# Patient Record
Sex: Female | Born: 1985 | Race: White | Hispanic: No | Smoking: Former smoker
Health system: Southern US, Community
[De-identification: ages and names within clinical notes are randomized; demographics above are authoritative.]

## PROBLEM LIST (undated history)

## (undated) ENCOUNTER — Ambulatory Visit: Admission: RE | Discharge status: Absent without leave | Payer: BLUE CROSS/BLUE SHIELD | Source: Ambulatory Visit

## (undated) DIAGNOSIS — K589 Irritable bowel syndrome without diarrhea: Secondary | ICD-10-CM

## (undated) DIAGNOSIS — G43909 Migraine, unspecified, not intractable, without status migrainosus: Secondary | ICD-10-CM

## (undated) DIAGNOSIS — K259 Gastric ulcer, unspecified as acute or chronic, without hemorrhage or perforation: Secondary | ICD-10-CM

## (undated) DIAGNOSIS — R42 Dizziness and giddiness: Secondary | ICD-10-CM

## (undated) DIAGNOSIS — F329 Major depressive disorder, single episode, unspecified: Secondary | ICD-10-CM

## (undated) DIAGNOSIS — Z87820 Personal history of traumatic brain injury: Secondary | ICD-10-CM

## (undated) DIAGNOSIS — K5010 Crohn's disease of large intestine without complications: Secondary | ICD-10-CM

## (undated) DIAGNOSIS — F32A Depression, unspecified: Secondary | ICD-10-CM

## (undated) DIAGNOSIS — K5100 Ulcerative (chronic) pancolitis without complications: Secondary | ICD-10-CM

## (undated) DIAGNOSIS — F319 Bipolar disorder, unspecified: Secondary | ICD-10-CM

## (undated) DIAGNOSIS — F419 Anxiety disorder, unspecified: Secondary | ICD-10-CM

## (undated) HISTORY — PX: RHINOPLASTY: SUR1284

## (undated) HISTORY — PX: TUBAL LIGATION: SHX77

## (undated) HISTORY — PX: TONSILLECTOMY: SUR1361

## (undated) HISTORY — PX: NASAL SINUS SURGERY: SHX719

---

## 2006-03-22 ENCOUNTER — Ambulatory Visit: Payer: Self-pay | Admitting: Gastroenterology

## 2006-03-26 ENCOUNTER — Emergency Department: Payer: Self-pay | Admitting: Emergency Medicine

## 2006-04-26 ENCOUNTER — Ambulatory Visit: Payer: Self-pay | Admitting: Gastroenterology

## 2008-02-16 ENCOUNTER — Ambulatory Visit: Payer: Self-pay | Admitting: Unknown Physician Specialty

## 2008-02-17 ENCOUNTER — Ambulatory Visit: Payer: Self-pay | Admitting: Unknown Physician Specialty

## 2008-02-19 ENCOUNTER — Ambulatory Visit: Payer: Self-pay | Admitting: Unknown Physician Specialty

## 2008-03-23 ENCOUNTER — Ambulatory Visit: Payer: Self-pay | Admitting: Unknown Physician Specialty

## 2008-07-09 ENCOUNTER — Emergency Department: Payer: Self-pay | Admitting: Internal Medicine

## 2008-07-24 ENCOUNTER — Emergency Department: Payer: Self-pay | Admitting: Internal Medicine

## 2008-08-08 ENCOUNTER — Emergency Department: Payer: Self-pay | Admitting: Emergency Medicine

## 2008-08-17 ENCOUNTER — Ambulatory Visit: Payer: Self-pay | Admitting: Unknown Physician Specialty

## 2008-08-19 ENCOUNTER — Inpatient Hospital Stay: Payer: Self-pay | Admitting: Unknown Physician Specialty

## 2008-09-06 ENCOUNTER — Ambulatory Visit: Payer: Self-pay | Admitting: Internal Medicine

## 2008-10-18 ENCOUNTER — Inpatient Hospital Stay: Payer: Self-pay | Admitting: Internal Medicine

## 2008-11-01 ENCOUNTER — Ambulatory Visit: Payer: Self-pay | Admitting: Family Medicine

## 2008-12-27 ENCOUNTER — Emergency Department: Payer: Self-pay | Admitting: Emergency Medicine

## 2009-04-20 IMAGING — CR DG ABDOMEN 2V
1 series · 2 of 2 positions shown · non-contrast
Comparison: none

REASON FOR EXAM: Abdominal pain
                 Call report to Dr. Risom
COMMENTS:

[Series 1: view not recorded · 0.17mm/px · 2 of 2 slices shown]
[im 1/2]
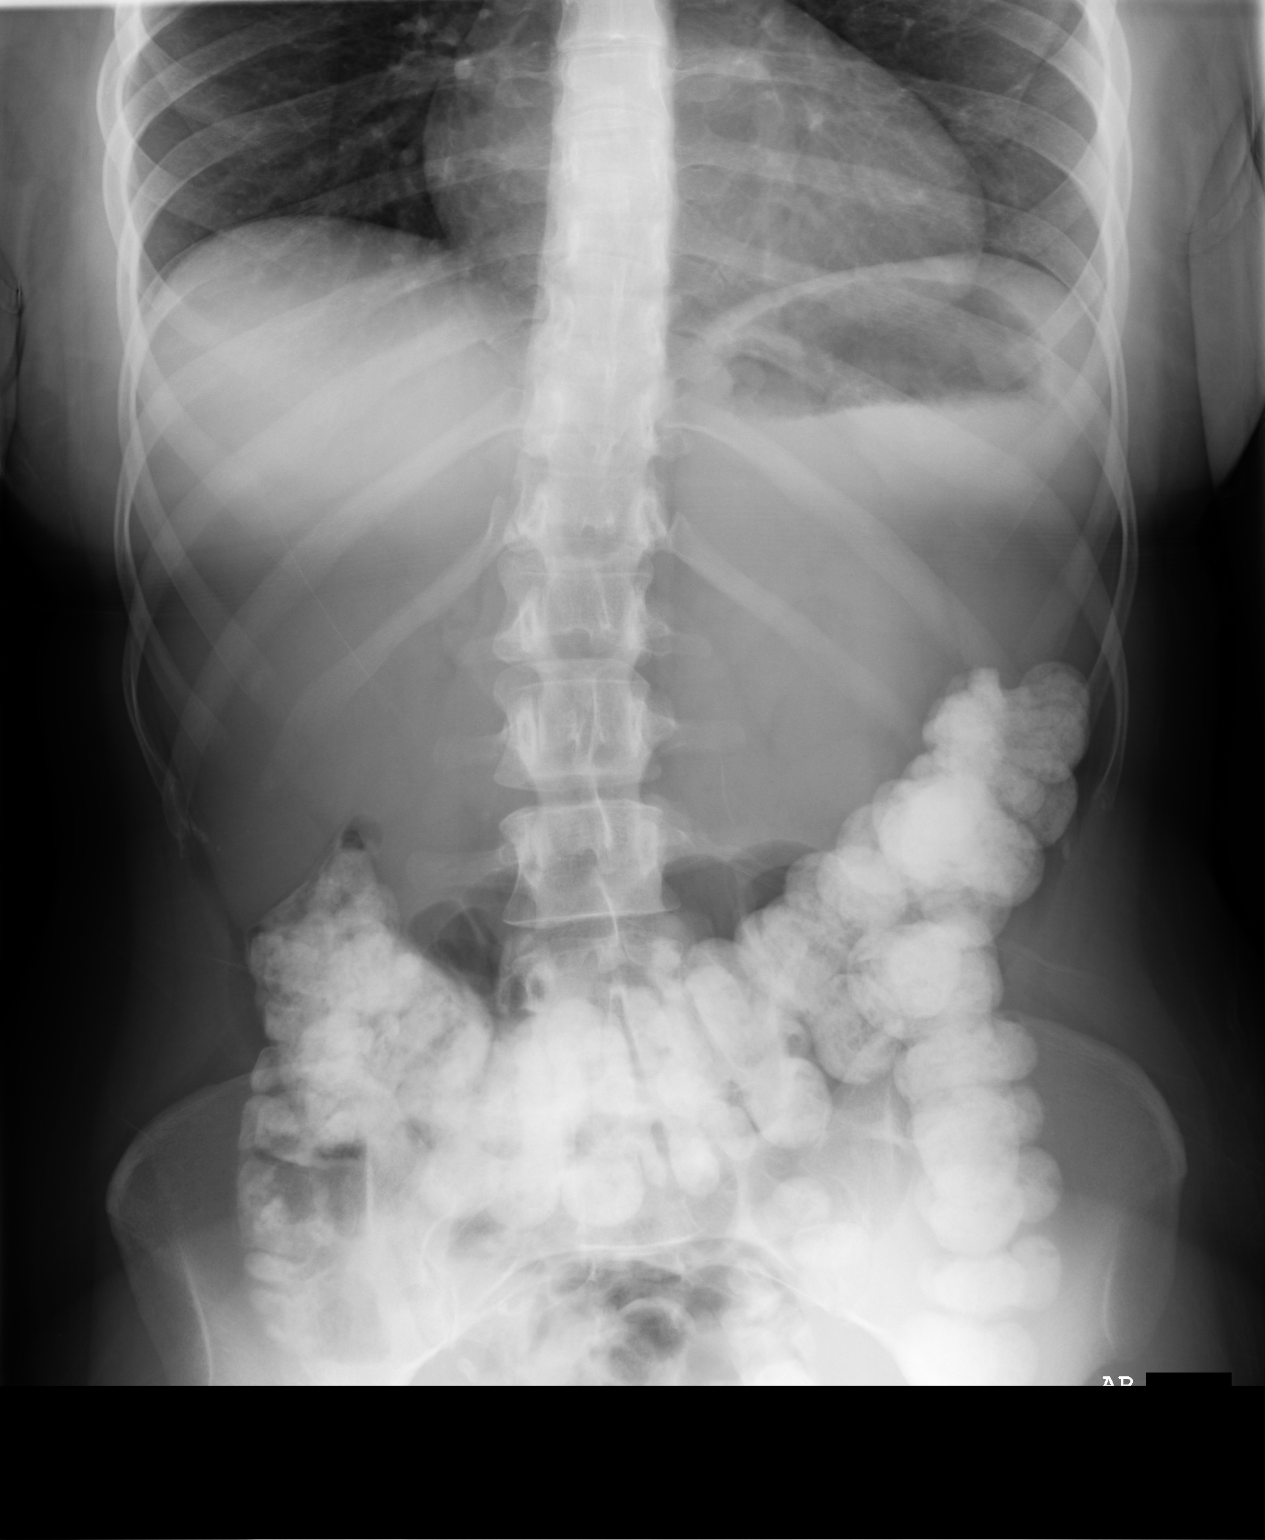
[im 2/2]
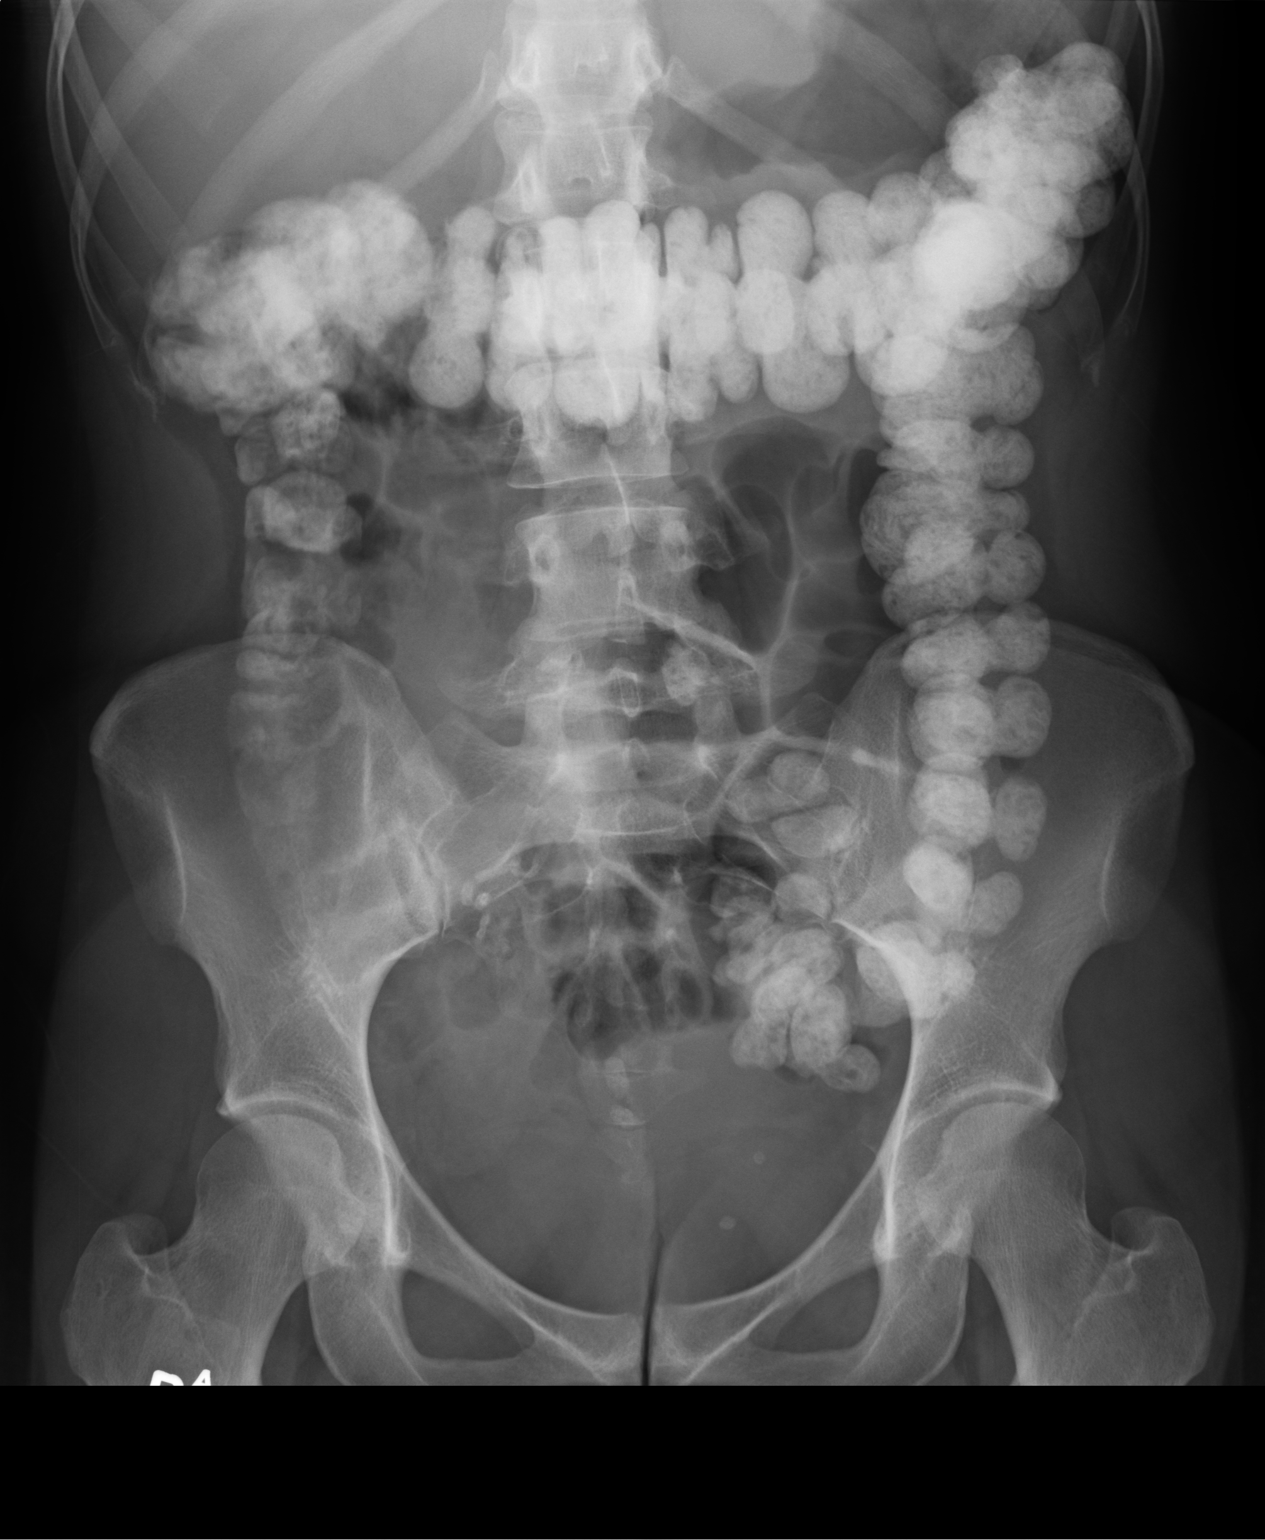

[2 of 2 positions shown; findings below may reference images not displayed]

PROCEDURE:     DXR - DXR ABDOMEN 2 V FLAT AND ERECT  - August 19, 2008  [DATE]

RESULT:     Flat and erect views of the abdomen were obtained.

There is observed contrast in the colon compatible with residual contrast
from prior CT. No dilated loops of small bowel are seen to indicate bowel
obstruction. The stomach is moderately distended with fluid. There are a few
phleboliths observed in the pelvis. No acute bony abnormalities are seen.
IMPRESSION: 1.  There is mild distention of the stomach with fluid.
2.  No bowel obstruction is identified and no significantly dilated loops of
bowel are seen.

## 2009-04-20 IMAGING — CR DG CHEST 2V
1 series · 2 of 2 positions shown · non-contrast
Comparison: none

REASON FOR EXAM: Nausea and vomiting, abdominal pain
COMMENTS:

PROCEDURE:     DXR - DXR CHEST PA (OR AP) AND LATERAL  - August 19, 2008  [DATE]
RESULT:     The lung fields are clear. The heart, mediastinal and osseous
structures show no significant abnormalities.

[Series 1: view not recorded · 0.17mm/px · 2 of 2 slices shown]
[im 1/2]
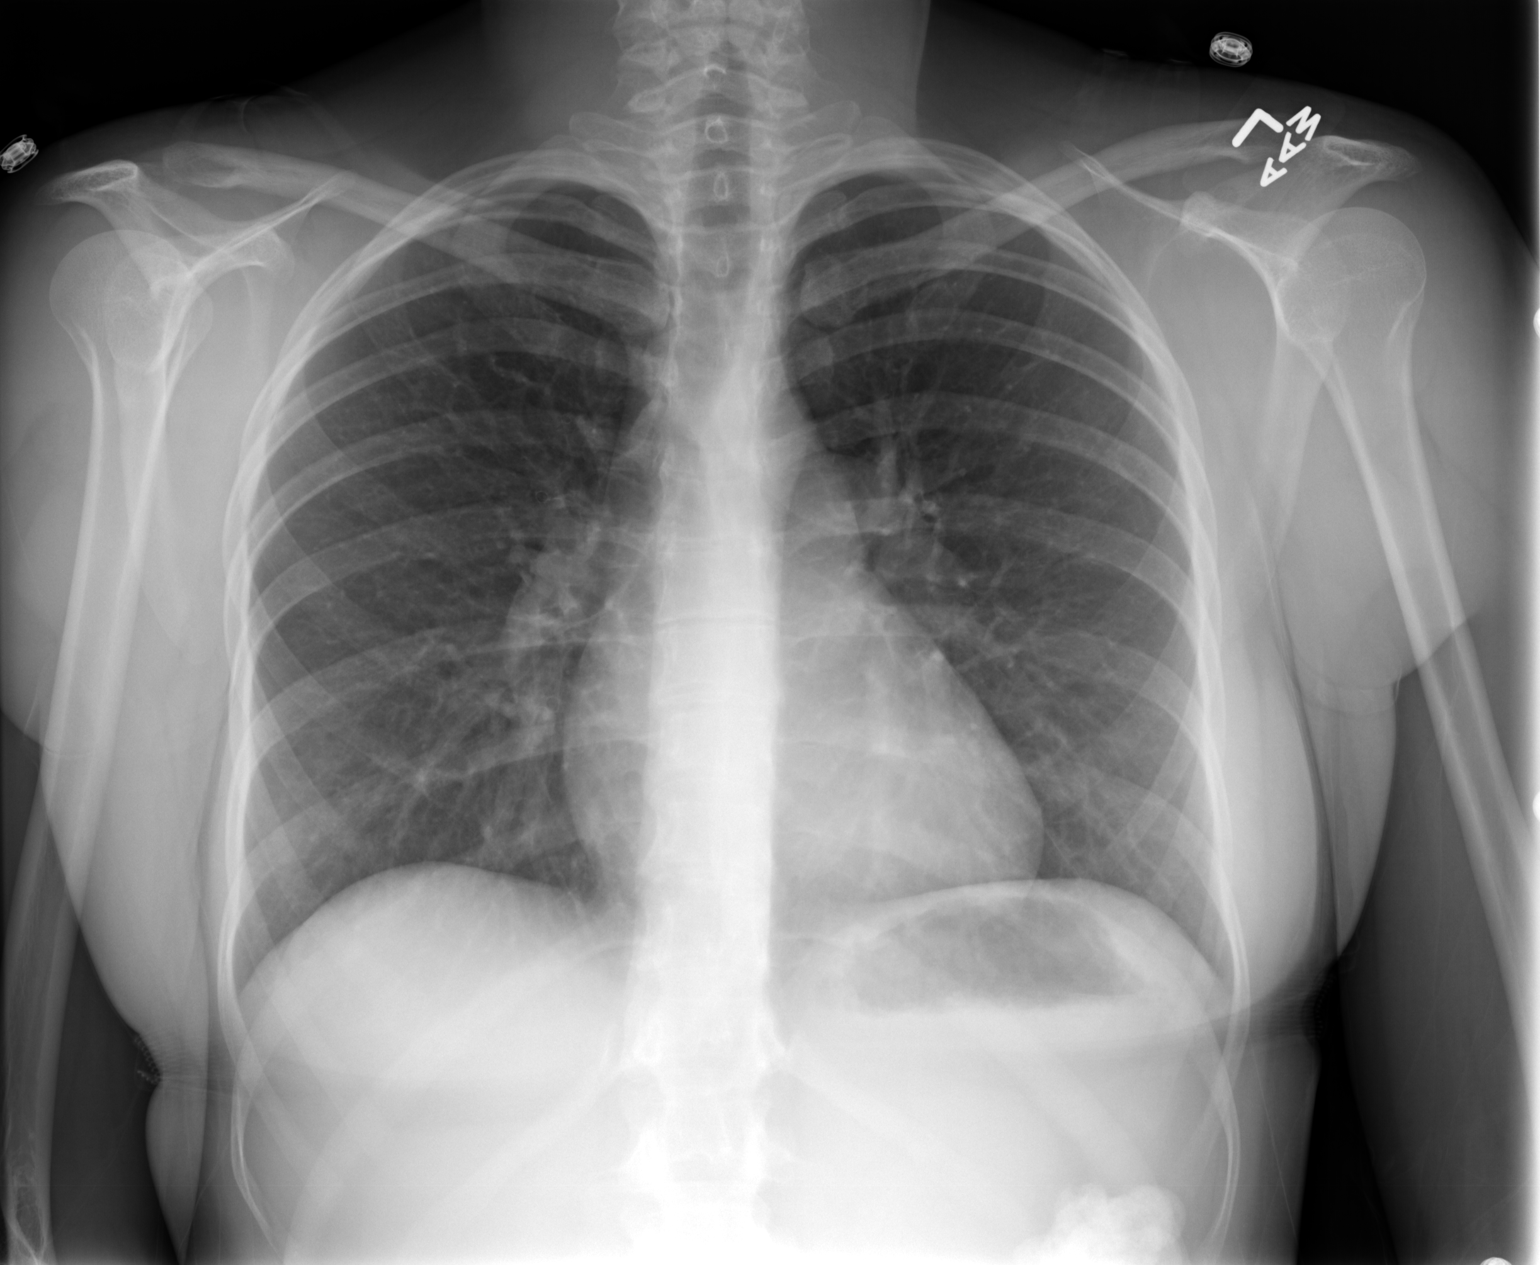
[im 2/2]
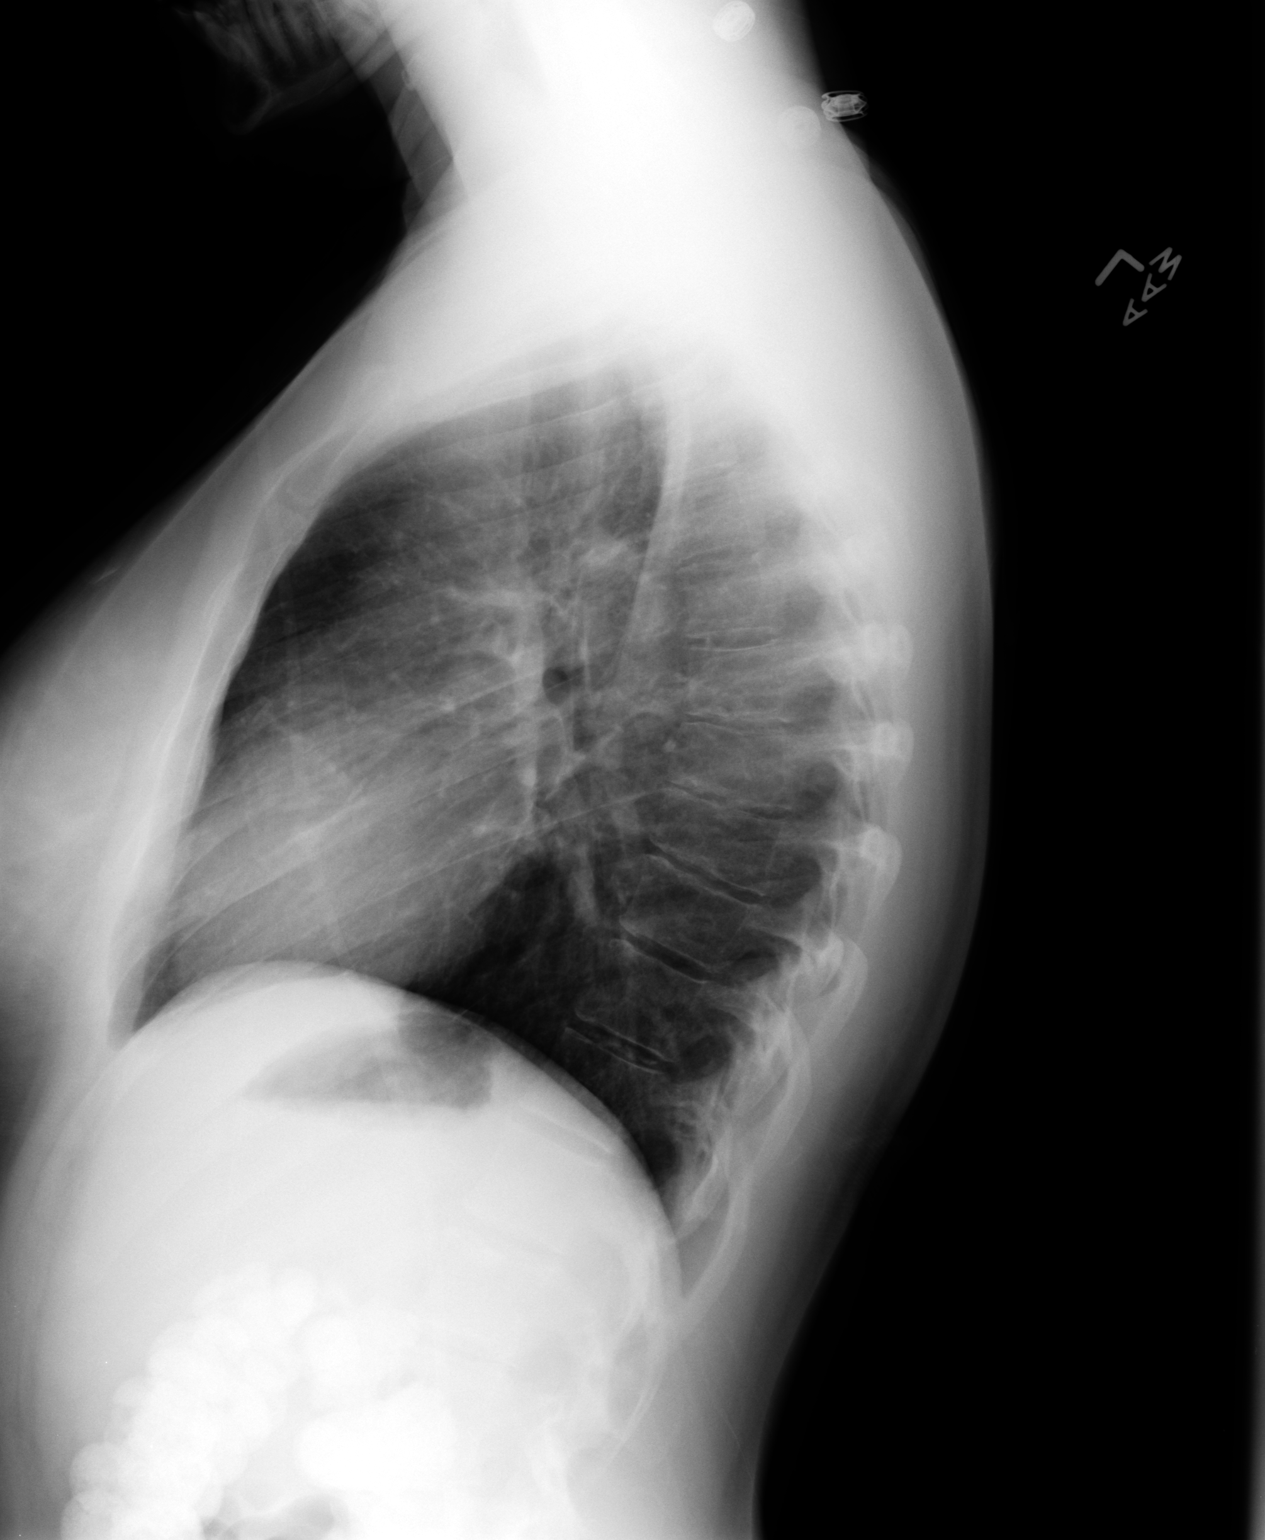

[2 of 2 positions shown; findings below may reference images not displayed]

IMPRESSION: No significant abnormalities are noted.

## 2009-07-17 ENCOUNTER — Inpatient Hospital Stay: Payer: Self-pay | Admitting: Internal Medicine

## 2009-08-25 ENCOUNTER — Emergency Department: Payer: Self-pay | Admitting: Emergency Medicine

## 2009-09-14 ENCOUNTER — Ambulatory Visit: Payer: Self-pay | Admitting: Internal Medicine

## 2009-11-07 ENCOUNTER — Emergency Department: Payer: Self-pay | Admitting: Unknown Physician Specialty

## 2009-11-07 ENCOUNTER — Ambulatory Visit: Payer: Self-pay | Admitting: Family Medicine

## 2009-12-30 ENCOUNTER — Other Ambulatory Visit: Payer: Self-pay | Admitting: General Practice

## 2010-02-24 ENCOUNTER — Other Ambulatory Visit: Payer: Self-pay | Admitting: Physician Assistant

## 2010-07-24 ENCOUNTER — Ambulatory Visit: Payer: Self-pay | Admitting: Internal Medicine

## 2010-07-26 ENCOUNTER — Emergency Department: Payer: Self-pay | Admitting: Emergency Medicine

## 2010-12-28 ENCOUNTER — Ambulatory Visit: Payer: Self-pay

## 2011-12-01 ENCOUNTER — Emergency Department: Payer: Self-pay | Admitting: Unknown Physician Specialty

## 2011-12-01 LAB — COMPREHENSIVE METABOLIC PANEL
Alkaline Phosphatase: 70 U/L (ref 50–136)
Anion Gap: 9 (ref 7–16)
Bilirubin,Total: <0.1 mg/dL — ABNORMAL LOW (ref 0.2–1.0)
Calcium, Total: 8.9 mg/dL (ref 8.5–10.1)
Chloride: 108 mmol/L — ABNORMAL HIGH (ref 98–107)
Co2: 26 mmol/L (ref 21–32)
Creatinine: 0.85 mg/dL (ref 0.60–1.30)
EGFR (African American): >60
EGFR (Non-African Amer.): >60
Osmolality: 284 (ref 275–301)
Potassium: 4 mmol/L (ref 3.5–5.1)
SGOT(AST): 28 U/L (ref 15–37)
Sodium: 143 mmol/L (ref 136–145)

## 2011-12-01 LAB — URINALYSIS, COMPLETE
Bilirubin,UR: NEGATIVE
Blood: NEGATIVE
Leukocyte Esterase: NEGATIVE
Nitrite: NEGATIVE
Ph: 7 (ref 4.5–8.0)
Protein: NEGATIVE
RBC,UR: 2 /HPF (ref 0–5)
Squamous Epithelial: 6
WBC UR: 1 /HPF (ref 0–5)

## 2011-12-01 LAB — CBC
HCT: 41.7 % (ref 35.0–47.0)
HGB: 13.8 g/dL (ref 12.0–16.0)
MCV: 91 fL (ref 80–100)
RDW: 14 % (ref 11.5–14.5)
WBC: 9.5 10*3/uL (ref 3.6–11.0)

## 2011-12-01 LAB — LIPASE, BLOOD: Lipase: 117 U/L (ref 73–393)

## 2011-12-02 LAB — DIFFERENTIAL
Basophil #: 0.1 10*3/uL (ref 0.0–0.1)
Eosinophil #: 0.1 10*3/uL (ref 0.0–0.7)
Lymphocyte %: 34.7 %
Monocyte #: 0.7 10*3/uL (ref 0.0–0.7)
Neutrophil %: 56.1 %

## 2012-07-08 ENCOUNTER — Ambulatory Visit: Payer: Self-pay | Admitting: Otolaryngology

## 2012-07-24 ENCOUNTER — Ambulatory Visit: Payer: Self-pay | Admitting: Otolaryngology

## 2012-07-25 LAB — PATHOLOGY REPORT

## 2012-12-02 ENCOUNTER — Ambulatory Visit: Payer: Self-pay | Admitting: Family Medicine

## 2013-01-07 ENCOUNTER — Inpatient Hospital Stay: Payer: Self-pay | Admitting: Student

## 2013-01-07 LAB — CBC
HCT: 47.8 % — ABNORMAL HIGH (ref 35.0–47.0)
HGB: 15.7 g/dL (ref 12.0–16.0)
MCH: 29.5 pg (ref 26.0–34.0)
MCHC: 32.9 g/dL (ref 32.0–36.0)
MCV: 90 fL (ref 80–100)
Platelet: 173 10*3/uL (ref 150–440)
RBC: 5.34 10*6/uL — ABNORMAL HIGH (ref 3.80–5.20)
RDW: 17 % — ABNORMAL HIGH (ref 11.5–14.5)
WBC: 8.9 10*3/uL (ref 3.6–11.0)

## 2013-01-07 LAB — LIPASE, BLOOD: Lipase: 150 U/L (ref 73–393)

## 2013-01-07 LAB — URINALYSIS, COMPLETE
Bacteria: NONE SEEN
Bilirubin,UR: NEGATIVE
Blood: NEGATIVE
Glucose,UR: NEGATIVE mg/dL (ref 0–75)
Leukocyte Esterase: NEGATIVE
RBC,UR: 1 /HPF (ref 0–5)
Specific Gravity: 1.006 (ref 1.003–1.030)
Squamous Epithelial: 3
WBC UR: <1 /HPF (ref 0–5)

## 2013-01-07 LAB — COMPREHENSIVE METABOLIC PANEL
BUN: 8 mg/dL (ref 7–18)
Bilirubin,Total: 0.4 mg/dL (ref 0.2–1.0)
Calcium, Total: 9.2 mg/dL (ref 8.5–10.1)
EGFR (African American): >60
EGFR (Non-African Amer.): >60
Glucose: 88 mg/dL (ref 65–99)
Osmolality: 273 (ref 275–301)
Potassium: 4.2 mmol/L (ref 3.5–5.1)
SGOT(AST): 12 U/L — ABNORMAL LOW (ref 15–37)
Total Protein: 7.5 g/dL (ref 6.4–8.2)

## 2013-01-08 LAB — CBC WITH DIFFERENTIAL/PLATELET
Basophil #: 0 10*3/uL (ref 0.0–0.1)
Eosinophil #: 0 10*3/uL (ref 0.0–0.7)
HCT: 39.8 % (ref 35.0–47.0)
HGB: 13.5 g/dL (ref 12.0–16.0)
MCH: 30.7 pg (ref 26.0–34.0)
Monocyte %: 1.8 %
Neutrophil #: 5.5 10*3/uL (ref 1.4–6.5)
Neutrophil %: 85.9 %
Platelet: 146 10*3/uL — ABNORMAL LOW (ref 150–440)
WBC: 6.4 10*3/uL (ref 3.6–11.0)

## 2013-01-08 LAB — BASIC METABOLIC PANEL
Anion Gap: 5 — ABNORMAL LOW (ref 7–16)
BUN: 5 mg/dL — ABNORMAL LOW (ref 7–18)
Chloride: 110 mmol/L — ABNORMAL HIGH (ref 98–107)
Co2: 25 mmol/L (ref 21–32)
Creatinine: 0.8 mg/dL (ref 0.60–1.30)
EGFR (African American): >60
EGFR (Non-African Amer.): >60
Glucose: 138 mg/dL — ABNORMAL HIGH (ref 65–99)
Potassium: 4.1 mmol/L (ref 3.5–5.1)

## 2013-01-08 LAB — SEDIMENTATION RATE: Erythrocyte Sed Rate: 3 mm/hr (ref 0–20)

## 2013-01-09 LAB — PLATELET COUNT: Platelet: 135 10*3/uL — ABNORMAL LOW (ref 150–440)

## 2013-01-09 LAB — HEMOGLOBIN: HGB: 12 g/dL (ref 12.0–16.0)

## 2013-01-10 LAB — HEMOGLOBIN: HGB: 12.4 g/dL (ref 12.0–16.0)

## 2013-01-10 LAB — PLATELET COUNT: Platelet: 136 10*3/uL — ABNORMAL LOW (ref 150–440)

## 2013-01-11 LAB — BASIC METABOLIC PANEL
BUN: 6 mg/dL — ABNORMAL LOW (ref 7–18)
Chloride: 103 mmol/L (ref 98–107)
EGFR (African American): >60
Osmolality: 270 (ref 275–301)
Potassium: 4.1 mmol/L (ref 3.5–5.1)

## 2013-01-13 LAB — CULTURE, BLOOD (SINGLE)

## 2013-01-14 LAB — PATHOLOGY REPORT

## 2013-06-11 ENCOUNTER — Ambulatory Visit: Payer: Self-pay

## 2013-06-29 ENCOUNTER — Ambulatory Visit: Payer: Self-pay

## 2013-07-16 ENCOUNTER — Emergency Department: Payer: Self-pay | Admitting: Emergency Medicine

## 2013-07-16 LAB — COMPREHENSIVE METABOLIC PANEL
Anion Gap: 2 — ABNORMAL LOW (ref 7–16)
Chloride: 109 mmol/L — ABNORMAL HIGH (ref 98–107)
Creatinine: 0.62 mg/dL (ref 0.60–1.30)
EGFR (African American): >60
EGFR (Non-African Amer.): >60
Glucose: 74 mg/dL (ref 65–99)
Osmolality: 270 (ref 275–301)
Potassium: 4.1 mmol/L (ref 3.5–5.1)
Sodium: 137 mmol/L (ref 136–145)
Total Protein: 7 g/dL (ref 6.4–8.2)

## 2013-07-16 LAB — CBC
HCT: 44.1 % (ref 35.0–47.0)
MCH: 31.1 pg (ref 26.0–34.0)
MCHC: 33.2 g/dL (ref 32.0–36.0)
MCV: 93 fL (ref 80–100)
Platelet: 160 10*3/uL (ref 150–440)
RDW: 13.9 % (ref 11.5–14.5)
WBC: 8.1 10*3/uL (ref 3.6–11.0)

## 2013-07-16 LAB — DRUG SCREEN, URINE
Amphetamines, Ur Screen: NEGATIVE (ref ?–1000)
Barbiturates, Ur Screen: NEGATIVE (ref ?–200)
Benzodiazepine, Ur Scrn: NEGATIVE (ref ?–200)
MDMA (Ecstasy)Ur Screen: POSITIVE (ref ?–500)
Phencyclidine (PCP) Ur S: NEGATIVE (ref ?–25)

## 2013-07-16 LAB — PREGNANCY, URINE: Pregnancy Test, Urine: NEGATIVE m[IU]/mL

## 2013-07-16 LAB — URINALYSIS, COMPLETE
Blood: NEGATIVE
Glucose,UR: NEGATIVE mg/dL (ref 0–75)
Leukocyte Esterase: NEGATIVE
Nitrite: NEGATIVE
Ph: 8 (ref 4.5–8.0)
Protein: NEGATIVE
RBC,UR: 1 /HPF (ref 0–5)
Specific Gravity: 1.015 (ref 1.003–1.030)
Squamous Epithelial: 4

## 2013-07-16 LAB — TSH: Thyroid Stimulating Horm: 0.77 u[IU]/mL

## 2013-07-16 LAB — ETHANOL: Ethanol: <3 mg/dL

## 2014-01-04 ENCOUNTER — Emergency Department: Payer: Self-pay | Admitting: Emergency Medicine

## 2014-01-04 LAB — URINALYSIS, COMPLETE
Bilirubin,UR: NEGATIVE
Blood: NEGATIVE
Glucose,UR: NEGATIVE mg/dL (ref 0–75)
Ketone: NEGATIVE
Leukocyte Esterase: NEGATIVE
Nitrite: NEGATIVE
PH: 7 (ref 4.5–8.0)
PROTEIN: NEGATIVE
RBC,UR: <1 /HPF (ref 0–5)
Specific Gravity: 1.013 (ref 1.003–1.030)
Squamous Epithelial: 3
WBC UR: <1 /HPF (ref 0–5)

## 2014-01-04 LAB — CBC WITH DIFFERENTIAL/PLATELET
Basophil #: 0 10*3/uL (ref 0.0–0.1)
Basophil %: 0.3 %
EOS ABS: 0 10*3/uL (ref 0.0–0.7)
EOS PCT: 0 %
HCT: 44.6 % (ref 35.0–47.0)
HGB: 14 g/dL (ref 12.0–16.0)
Lymphocyte #: 0.9 10*3/uL — ABNORMAL LOW (ref 1.0–3.6)
Lymphocyte %: 6.1 %
MCH: 29.3 pg (ref 26.0–34.0)
MCHC: 31.4 g/dL — AB (ref 32.0–36.0)
MCV: 93 fL (ref 80–100)
Monocyte #: 1 x10 3/mm — ABNORMAL HIGH (ref 0.2–0.9)
Monocyte %: 6.6 %
Neutrophil #: 12.7 10*3/uL — ABNORMAL HIGH (ref 1.4–6.5)
Neutrophil %: 87 %
Platelet: 160 10*3/uL (ref 150–440)
RBC: 4.78 10*6/uL (ref 3.80–5.20)
RDW: 14.5 % (ref 11.5–14.5)
WBC: 14.6 10*3/uL — ABNORMAL HIGH (ref 3.6–11.0)

## 2014-01-04 LAB — COMPREHENSIVE METABOLIC PANEL
ALK PHOS: 99 U/L
ALT: 11 U/L — AB (ref 12–78)
AST: 9 U/L — AB (ref 15–37)
Albumin: 3.7 g/dL (ref 3.4–5.0)
Anion Gap: 4 — ABNORMAL LOW (ref 7–16)
BUN: 5 mg/dL — AB (ref 7–18)
Bilirubin,Total: 0.3 mg/dL (ref 0.2–1.0)
CALCIUM: 9 mg/dL (ref 8.5–10.1)
Chloride: 103 mmol/L (ref 98–107)
Co2: 26 mmol/L (ref 21–32)
Creatinine: 0.72 mg/dL (ref 0.60–1.30)
EGFR (African American): >60
Glucose: 101 mg/dL — ABNORMAL HIGH (ref 65–99)
OSMOLALITY: 264 (ref 275–301)
Potassium: 4.1 mmol/L (ref 3.5–5.1)
Sodium: 133 mmol/L — ABNORMAL LOW (ref 136–145)
Total Protein: 7.2 g/dL (ref 6.4–8.2)

## 2014-01-04 LAB — LIPASE, BLOOD: Lipase: 147 U/L (ref 73–393)

## 2014-03-25 LAB — HCG, QUANTITATIVE, PREGNANCY: BETA HCG, QUANT.: 35605 m[IU]/mL — AB

## 2014-03-25 LAB — URINALYSIS, COMPLETE
BLOOD: NEGATIVE
Bacteria: NONE SEEN
Bilirubin,UR: NEGATIVE
Glucose,UR: NEGATIVE mg/dL (ref 0–75)
Ketone: NEGATIVE
Leukocyte Esterase: NEGATIVE
Nitrite: NEGATIVE
Ph: 6 (ref 4.5–8.0)
Protein: NEGATIVE
RBC,UR: 1 /HPF (ref 0–5)
Specific Gravity: 1.009 (ref 1.003–1.030)
WBC UR: 2 /HPF (ref 0–5)

## 2014-03-25 LAB — CBC WITH DIFFERENTIAL/PLATELET
Basophil #: 0 10*3/uL (ref 0.0–0.1)
Basophil %: 0.4 %
EOS PCT: 1.1 %
Eosinophil #: 0.1 10*3/uL (ref 0.0–0.7)
HCT: 40.3 % (ref 35.0–47.0)
HGB: 13.5 g/dL (ref 12.0–16.0)
Lymphocyte #: 2.2 10*3/uL (ref 1.0–3.6)
Lymphocyte %: 23.6 %
MCH: 31.3 pg (ref 26.0–34.0)
MCHC: 33.6 g/dL (ref 32.0–36.0)
MCV: 93 fL (ref 80–100)
MONO ABS: 0.8 x10 3/mm (ref 0.2–0.9)
MONOS PCT: 7.9 %
NEUTROS ABS: 6.4 10*3/uL (ref 1.4–6.5)
Neutrophil %: 67 %
Platelet: 140 10*3/uL — ABNORMAL LOW (ref 150–440)
RBC: 4.31 10*6/uL (ref 3.80–5.20)
RDW: 13.8 % (ref 11.5–14.5)
WBC: 9.5 10*3/uL (ref 3.6–11.0)

## 2014-03-25 LAB — COMPREHENSIVE METABOLIC PANEL
ALBUMIN: 3.3 g/dL — AB (ref 3.4–5.0)
ALT: 11 U/L — AB (ref 12–78)
ANION GAP: 4 — AB (ref 7–16)
Alkaline Phosphatase: 61 U/L
BILIRUBIN TOTAL: 0.1 mg/dL — AB (ref 0.2–1.0)
BUN: 5 mg/dL — ABNORMAL LOW (ref 7–18)
CALCIUM: 8.8 mg/dL (ref 8.5–10.1)
Chloride: 109 mmol/L — ABNORMAL HIGH (ref 98–107)
Co2: 26 mmol/L (ref 21–32)
Creatinine: 0.63 mg/dL (ref 0.60–1.30)
EGFR (Non-African Amer.): >60
Glucose: 83 mg/dL (ref 65–99)
Osmolality: 274 (ref 275–301)
Potassium: 3.8 mmol/L (ref 3.5–5.1)
SGOT(AST): 21 U/L (ref 15–37)
Sodium: 139 mmol/L (ref 136–145)
Total Protein: 6.8 g/dL (ref 6.4–8.2)

## 2014-03-25 LAB — LIPASE, BLOOD: Lipase: 159 U/L (ref 73–393)

## 2014-03-26 ENCOUNTER — Inpatient Hospital Stay: Payer: Self-pay | Admitting: Obstetrics and Gynecology

## 2014-03-28 LAB — COMPREHENSIVE METABOLIC PANEL
Albumin: 2.6 g/dL — ABNORMAL LOW (ref 3.4–5.0)
Alkaline Phosphatase: 53 U/L
Anion Gap: 5 — ABNORMAL LOW (ref 7–16)
BUN: 3 mg/dL — ABNORMAL LOW (ref 7–18)
Bilirubin,Total: 0.3 mg/dL (ref 0.2–1.0)
Calcium, Total: 7.8 mg/dL — ABNORMAL LOW (ref 8.5–10.1)
Chloride: 108 mmol/L — ABNORMAL HIGH (ref 98–107)
Co2: 24 mmol/L (ref 21–32)
Creatinine: 0.62 mg/dL (ref 0.60–1.30)
EGFR (African American): >60
GLUCOSE: 82 mg/dL (ref 65–99)
Osmolality: 269 (ref 275–301)
Potassium: 3.5 mmol/L (ref 3.5–5.1)
SGOT(AST): 43 U/L — ABNORMAL HIGH (ref 15–37)
SGPT (ALT): 16 U/L (ref 12–78)
SODIUM: 137 mmol/L (ref 136–145)
Total Protein: 5.7 g/dL — ABNORMAL LOW (ref 6.4–8.2)

## 2014-03-28 LAB — CBC WITH DIFFERENTIAL/PLATELET
Basophil #: 0 10*3/uL (ref 0.0–0.1)
Basophil %: 0.2 %
EOS ABS: 0.1 10*3/uL (ref 0.0–0.7)
Eosinophil %: 0.7 %
HCT: 35.9 % (ref 35.0–47.0)
HGB: 11.8 g/dL — ABNORMAL LOW (ref 12.0–16.0)
LYMPHS ABS: 1.5 10*3/uL (ref 1.0–3.6)
Lymphocyte %: 17.7 %
MCH: 30.6 pg (ref 26.0–34.0)
MCHC: 32.8 g/dL (ref 32.0–36.0)
MCV: 93 fL (ref 80–100)
MONOS PCT: 6.8 %
Monocyte #: 0.6 x10 3/mm (ref 0.2–0.9)
NEUTROS PCT: 74.6 %
Neutrophil #: 6.2 10*3/uL (ref 1.4–6.5)
PLATELETS: 120 10*3/uL — AB (ref 150–440)
RBC: 3.85 10*6/uL (ref 3.80–5.20)
RDW: 13.3 % (ref 11.5–14.5)
WBC: 8.3 10*3/uL (ref 3.6–11.0)

## 2014-05-16 ENCOUNTER — Observation Stay: Payer: Self-pay | Admitting: Obstetrics and Gynecology

## 2014-05-16 LAB — CBC WITH DIFFERENTIAL/PLATELET
BASOS PCT: 0.4 %
Basophil #: 0 10*3/uL (ref 0.0–0.1)
EOS ABS: 0.1 10*3/uL (ref 0.0–0.7)
EOS PCT: 1 %
HCT: 34.4 % — AB (ref 35.0–47.0)
HGB: 11.1 g/dL — AB (ref 12.0–16.0)
LYMPHS ABS: 1.8 10*3/uL (ref 1.0–3.6)
Lymphocyte %: 15.6 %
MCH: 30.2 pg (ref 26.0–34.0)
MCHC: 32.3 g/dL (ref 32.0–36.0)
MCV: 93 fL (ref 80–100)
Monocyte #: 0.8 x10 3/mm (ref 0.2–0.9)
Monocyte %: 6.7 %
NEUTROS ABS: 8.7 10*3/uL — AB (ref 1.4–6.5)
NEUTROS PCT: 76.3 %
PLATELETS: 138 10*3/uL — AB (ref 150–440)
RBC: 3.68 10*6/uL — ABNORMAL LOW (ref 3.80–5.20)
RDW: 14 % (ref 11.5–14.5)
WBC: 11.3 10*3/uL — ABNORMAL HIGH (ref 3.6–11.0)

## 2014-05-16 LAB — HEPATIC FUNCTION PANEL A (ARMC)
ALK PHOS: 61 U/L
ALT: 13 U/L (ref 12–78)
Albumin: 2.6 g/dL — ABNORMAL LOW (ref 3.4–5.0)
Bilirubin,Total: 0.1 mg/dL — ABNORMAL LOW (ref 0.2–1.0)
SGOT(AST): 15 U/L (ref 15–37)
Total Protein: 5.6 g/dL — ABNORMAL LOW (ref 6.4–8.2)

## 2014-05-16 LAB — URINALYSIS, COMPLETE
BILIRUBIN, UR: NEGATIVE
BLOOD: NEGATIVE
GLUCOSE, UR: NEGATIVE mg/dL (ref 0–75)
Ketone: NEGATIVE
NITRITE: NEGATIVE
PH: 5 (ref 4.5–8.0)
Protein: 30
RBC,UR: 5 /HPF (ref 0–5)
Specific Gravity: 1.024 (ref 1.003–1.030)
Squamous Epithelial: 16
WBC UR: 7 /HPF (ref 0–5)

## 2014-06-09 DIAGNOSIS — O0990 Supervision of high risk pregnancy, unspecified, unspecified trimester: Secondary | ICD-10-CM | POA: Insufficient documentation

## 2014-06-11 ENCOUNTER — Emergency Department: Payer: Self-pay | Admitting: Emergency Medicine

## 2014-06-11 ENCOUNTER — Ambulatory Visit: Payer: Self-pay | Admitting: Vascular Surgery

## 2014-06-12 DIAGNOSIS — O99119 Other diseases of the blood and blood-forming organs and certain disorders involving the immune mechanism complicating pregnancy, unspecified trimester: Secondary | ICD-10-CM | POA: Insufficient documentation

## 2014-06-12 DIAGNOSIS — K529 Noninfective gastroenteritis and colitis, unspecified: Secondary | ICD-10-CM | POA: Insufficient documentation

## 2014-06-12 DIAGNOSIS — D696 Thrombocytopenia, unspecified: Secondary | ICD-10-CM | POA: Insufficient documentation

## 2014-06-12 DIAGNOSIS — R45851 Suicidal ideations: Secondary | ICD-10-CM | POA: Insufficient documentation

## 2014-06-12 DIAGNOSIS — I878 Other specified disorders of veins: Secondary | ICD-10-CM | POA: Insufficient documentation

## 2014-06-29 DIAGNOSIS — Z789 Other specified health status: Secondary | ICD-10-CM | POA: Insufficient documentation

## 2014-08-30 DIAGNOSIS — Z006 Encounter for examination for normal comparison and control in clinical research program: Secondary | ICD-10-CM | POA: Insufficient documentation

## 2014-08-30 DIAGNOSIS — F329 Major depressive disorder, single episode, unspecified: Secondary | ICD-10-CM | POA: Insufficient documentation

## 2014-08-30 DIAGNOSIS — F419 Anxiety disorder, unspecified: Secondary | ICD-10-CM

## 2014-08-30 DIAGNOSIS — F32A Depression, unspecified: Secondary | ICD-10-CM | POA: Insufficient documentation

## 2014-11-06 ENCOUNTER — Ambulatory Visit: Payer: Self-pay

## 2015-01-18 ENCOUNTER — Ambulatory Visit: Payer: Self-pay | Admitting: Vascular Surgery

## 2015-02-01 ENCOUNTER — Ambulatory Visit
Admit: 2015-02-01 | Disposition: A | Discharge status: Home / Self Care | Payer: Self-pay | Attending: Vascular Surgery | Admitting: Vascular Surgery

## 2015-02-11 ENCOUNTER — Ambulatory Visit
Admit: 2015-02-11 | Disposition: A | Discharge status: Home / Self Care | Payer: Self-pay | Attending: Family Medicine | Admitting: Family Medicine

## 2015-02-15 NOTE — Op Note (Signed)
PATIENT NAME:  [name], [name] MR#:  [id number] DATE OF BIRTH:  1986/05/23  DATE OF PROCEDURE:  07/24/2012  PREOPERATIVE DIAGNOSES:  1. Chronic sinusitis.  2. 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   POSTOPERATIVE DIAGNOSES:  1. Chronic sinusitis.  2. 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   PROCEDURES:  1. Image guided sinus surgery (Stryker navigation).  2. Bilateral anterior and posterior ethmoidectomies with tissue removal.  3. Bilateral maxillary sinus antrostomies with tissue removal.  4. Internal nasal valve stenosis repair with left spreader graft (harvested from septum).  5. Septoplasty.  6. Bilateral submucous resection of the inferior turbinates.   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  After general endotracheal anesthesia had been induced the patient was turned 90 degrees counter clockwise from anesthesia.  The nose was anesthetized with infraorbital nerve blocks and septal injection with 0.5% Lidocaine and 0.25% Bupivacaine mixed with 1:150,000 with Epinephrine and phenylephrine Lidocaine soaked pledgets, two on each side were placed and the face was prepped and draped in the usual fashion.  The pledgets were removed.  A 15 blade was used to make a left-sided hemitransfixion incision and septal mucoperichondrial mucoperiosteal leaflets elevated.  There was a large inferior spur that was resected with Jansen-Middleton forceps.  The remaining septum was deviated back and forth in an accordion like fashion.  The bony cartilaginous junction was then  divided and a moderate amount of vomer and perpendicular plate was taken down with Jansen-Middleton forceps, releasing the tension on the remaining septum.  The septum then swung back into the midline.  The septal leaflets were closed with quilting 4-0 chromic suture.  The left sided hemitransfixion incision was closed with 4-0 plain gut.  Attention was directed to the turbinates which had been previously injected on the left.  The head of the inferior turbinate on the left was incised with a 15 blade and the medial mucoperiosteum was elevated using a Psychologist, educational.  Once this had been elevated Knight scissors were used to resect the conchal bone and lateral mucoperiosteum.  The inferior margin of the remaining mucoperiosteum was then cauterized with suction cautery.  An identical procedure was performed on the right inferior turbinate.    The left intercartilaginous incision was made with a 15 blade. The subnasal SMAS plane was elevated over the nasal dorsum and a #15 blade was used to disarticulate the upper lateral cartilage from the dorsal septum. A 1.6 x 3 mm spreader graft was then inserted after undermining had been carried out to allow for placement of the graft. The graft was secured with interrupted 5-0 PDS suture. The intercartilaginous incision was then closed with interrupted 5-0 chromic suture. The nose was not dressed immediately with Mastisol and paper tape, the sinus surgery was performed first.    The image-guided sinus surgery system mask was attached and registration was carried out in the standard fashion using the appropriate fiduciary points. Calibration of the system was confirmed and extensive review of the CT scan in all three dimensions preoperatively and intraoperatively was carried out.  Each instrument was registered and confirmed for anatomic accuracy.  The left side was addressed first.  The middle turbinate was deflected laterally at its anterior tip. The anterior tip was  taken down just enough to remove the lateral deflection. The uncinate process was then taken down completely with pediatric side-biting forceps. The natural maxillary sinus ostium was progressively enlarged by removing tissue to create a large maxillary antrostomy. Bone and mucosal tissue from the ethmoid sinus was taken down from anterior to posterior preserving the skull base and the lamina papyracea. There was significant inflammation of the ethmoid mucosa. The ethmoid cavity and maxillary sinus cavity were copiously irrigated and attention was directed to the right side where an identical procedure was performed after the concha bullosa had been taken down preserving the medial mucoperiosteum. The ethmoid and maxillary sinus cavities were then copiously irrigated and conservative amount of Surgiflo was placed. The nose was then dressed with Mastisol and half-inch paper tape. Temporary Telfa pledgets were placed. The patient was then returned to anesthesia, allowed to emerge from anesthesia in the Operating Room, taken to the recovery room in stable condition. There were no complications. Estimated blood loss 20 mL.   ____________________________ J. Nadeen Landau, MD jmc:cms D: 07/24/2012 15:32:05 ET T: 07/24/2012 17:31:00 ET JOB#: [id number]  cc: Janalee Dane, MD, <Dictator>  Nicholos Johns MD ELECTRONICALLY SIGNED 08/28/2012 10:33

## 2015-02-17 ENCOUNTER — Ambulatory Visit
Admit: 2015-02-17 | Disposition: A | Discharge status: Home / Self Care | Payer: Self-pay | Attending: Family Medicine | Admitting: Family Medicine

## 2015-02-17 LAB — COMPREHENSIVE METABOLIC PANEL
ALK PHOS: 69 U/L
ALT: 13 U/L — AB
Albumin: 4.3 g/dL
Anion Gap: 3 — ABNORMAL LOW (ref 7–16)
BILIRUBIN TOTAL: 0.4 mg/dL
BUN: 14 mg/dL
CALCIUM: 9 mg/dL
CREATININE: 0.63 mg/dL
Chloride: 110 mmol/L
Co2: 23 mmol/L
EGFR (African American): >60
EGFR (Non-African Amer.): >60
Glucose: 117 mg/dL — ABNORMAL HIGH
Potassium: 4.1 mmol/L
SGOT(AST): 18 U/L
Sodium: 136 mmol/L
Total Protein: 7.1 g/dL

## 2015-02-17 LAB — LIPID PANEL
Cholesterol: 158 mg/dL
HDL Cholesterol: 58 mg/dL
Ldl Cholesterol, Calc: 89 mg/dL
Triglycerides: 57 mg/dL
VLDL Cholesterol, Calc: 11 mg/dL

## 2015-02-17 LAB — TSH: THYROID STIMULATING HORM: 0.723 u[IU]/mL

## 2015-02-18 NOTE — Consult Note (Signed)
VSS afebrile, for prep today.  Abd still diffusely tender.Met B ok.  BP up a bit, may need to go on BP medication.  Electronic Signatures: Manya Silvas (MD)  (Signed on 16-Mar-14 14:05)  Authored  Last Updated: 16-Mar-14 14:05 by Manya Silvas (MD)

## 2015-02-18 NOTE — H&P (Signed)
PATIENT NAME:  [name], TISDELL MR#:  [id number] DATE OF BIRTH:  [birth year]  DATE OF ADMISSION:  01/07/2013  PRIMARY CARE PHYSICIAN:  None.   GASTROENTEROLOGIST IN THE PAST:  Dr. Vira Agar, but not seen since three years.   PRESENTING COMPLAINT:  Abdominal pain.   HISTORY OF PRESENT ILLNESS:  This is a 29 year old female with past medical history of Crohn's disease diagnosed 7 or 8 years ago and was on treatment for Crohn's disease for almost 4 to 5 years with multiple alternatives tried, but nothing was helping 100% and so she self-limit herself from treatment and tried to manage the symptoms with as needed meds and with diet control and everything and was successful until now.  For the last two years, was able to manage her symptoms at home without any specific treatment for Crohn's disease.  Yesterday, she felt nausea and vomiting and started having abdominal pain, specifically in the right upper quadrant, more.  She used Phenergan suppository that helped with nausea and vomiting, but her pain did not resolve.  She has complained of fever which she noticed 104 degrees Fahrenheit in the last two days and she was trying ibuprofen as needed for that.  She denies any diarrhea, but she said that she has baseline irritable bowel syndrome, constipation type and so her baseline is she has 2 bowel movements every 4 to 5 days.  Today in the ER, she had a bowel movement which was liquidy stool with most of the mucus in it.  No blood.  She is not able to eat anything since yesterday and that is why she came to the Emergency Room as she was not able to control her symptoms today with her usual modalities.  On further questioning, she says that 8 months ago she had sinus surgery on the face due to deviated nasal septum and multiple complications in the sinus and since then she has the sinus congested frequently and she has to take antibiotic course and everything so when she had fever and some congestion of her sinus she  thought that that might be a sinus infection and started taking amoxicillin also for the last 2 to 3 days.   REVIEW OF SYSTEMS:  CONSTITUTIONAL:  Positive for fever, but negative of fatigue, weakness.  Pain in the abdomen is present.  EYES:  No blurring, double vision or change in the vision.  EARS, NOSE, THROAT:  No tinnitus, ear pain, hearing loss or discharge.  RESPIRATORY:  No cough, wheezing, hemoptysis or dyspnea.  CARDIOVASCULAR:  No chest pain, orthopnea, edema, arrhythmia, palpitations.  GASTROINTESTINAL:  Has nausea, vomiting, but no diarrhea, has one episode of loose stool, mostly mucus in ER.  Abdominal pain, mostly in right upper quadrant.  No hematemesis or melena.  GENITOURINARY:  No dysuria, hematuria or increased frequency.  ENDOCRINOLOGY:  No heat or cold intolerance.  No polyuria or nocturia.  SKIN:  No rashes or lesions.  MUSCULOSKELETAL:  No pain or swelling of the joints.  NEUROLOGICAL:  No numbness, weakness, tremors or headache.  PSYCHIATRIC:  No anxiety, insomnia or bipolar disorder   PAST MEDICAL HISTORY:  Positive for multiple ulcers in GI tract due to salmonella infection 8 years ago, was diagnosed by upper and lower endoscopy and was treated and after one year she had again recurrence of the symptoms with severe pain and diarrhea and with CAT scan she was diagnosed with ulcerative colitis, was on treatment for a few years, but then she stopped herself taking  treatment and managing for the last 2 to 3 years with modification in diet and as needed medications for nausea and pain.   PAST SURGICAL HISTORY:  Deviated nasal septum and endoscopic sinus surgeries 8 months ago.   HOME MEDICATIONS:  Birth control pills, Xanax and Phenergan as needed, amoxicillin course as needed for sinus infections.   SOCIAL HISTORY:  She smokes 4 to 5 cigarettes a day, started smoking since she is diagnosed with Crohn's as she read the literature it helps with the Crohn's disease pain and  symptoms.  She drinks alcohol socially and denies any illegal drug use.  She is unemployed currently.   FAMILY HISTORY:  Father had cancer, diabetes and coronary artery disease.   PHYSICAL EXAMINATION:  VITAL SIGNS:  Temperature 98.7, pulse 101, respirations 18, blood pressure 152/96 and oxygen saturation 100% on room air.  GENERAL:  Fully alert, oriented to time, place and person and cooperative with history-taking and physical examination.  No acute distress.  HEENT:  Head and neck atraumatic.  Conjunctivae pink.  Oral mucosa moist.  NECK:  Supple.  No JVD.  RESPIRATORY:  Bilateral clear and equal air entry.  No creps or rhonchi.  CARDIOVASCULAR:  S1, S2 present, regular.  No murmur.  ABDOMEN:  Mild tenderness and guarding present on right upper quadrant and epigastric region, not able to feel any mass or organomegaly due to pain.  Bowel sounds present, normal.  SKIN:  No rashes.  JOINTS:  No swelling or tenderness.  NEUROLOGICAL:  Power 5 out of 5 all four limbs.  No tremor.  Follows commands.  PSYCHIATRIC:  Does not appear in any acute psychiatric illness at this time.   LABORATORY RESULTS:  Glucose 88, BUN 8, creatinine 0.7, sodium 138, potassium 4.2, chloride 107, CO2 24, lipase 150, total protein 7.5, albumin 3.7, bilirubin 0.4, alkaline phosphatase 103, SGOT 12, SGPT 19, WBC 8.9, hemoglobin 15.7, platelets 173, MCV 90.  Urinalysis grossly negative.  CT of the abdomen is done and it shows, considering patient's history of reflecting sequelae of early or mild Crohn's exacerbation.  No evidence of free fluid, no loculated fluid collection to suggest an abscess.  The appendix is identified and is unremarkable.  Moderate amount of fecal retention within the colon.   ASSESSMENT AND PLAN:  A 29 year old female with history of Crohn's disease, not on any treatment for the last two years came with abdominal pain, nausea and vomiting for 1 to 2 days and one episode of loose mucusy stool in ER with  fever for the last two days.  1.  Crohn's disease, acute flare-up.  ER physician spoke to Dr. Candace Cruise for discussing the management and he suggested to start on IV steroids.  We will continue IV steroids.  We will give IV fluids, keep her nothing by mouth, pain management and nausea management with IV medications.  We will provide her gastrointestinal prophylaxis also.  2.  Fever.  We will do blood culture as she is complaining of fever of 104 degrees Fahrenheit at home.  This might be due to Crohn's or as she has complaint of frequent sinusitis after sinus surgeries.  Currently will not start on any antibiotics for that, but can follow and see if she has fever or blood cultures then we might change.  3.  Repeated sinusitis after sinus surgery and congestion.  We will give her Flonase for now.  4.  Pain management with Dilaudid.  5.  Gastrointestinal prophylaxis with Nexium IV.  6.  For nausea and vomiting, IV Zofran as needed.   CODE STATUS:  FULL CODE.   Assessment and plan discussed with she and her mother in the room.   TOTAL TIME SPENT:  50 minutes.    ____________________________ Ceasar Lund Anselm Jungling, MD vgv:ea D: 01/07/2013 20:55:13 ET T: 01/07/2013 23:11:22 ET JOB#: [id number]  cc: Ceasar Lund. Anselm Jungling, MD, <Dictator> Vaughan Basta MD ELECTRONICALLY SIGNED 01/26/2013 9:35

## 2015-02-18 NOTE — Consult Note (Signed)
PATIENT NAME:  [name], KNODEL MR#:  [id number] DATE OF BIRTH:  [birth year]  DATE OF CONSULTATION:  01/08/2013  REFERRING PHYSICIAN:  Dr. Evon Slack CONSULTING PHYSICIAN:  Gaylyn Cheers, MD / Corky Sox. Earle, PA-C  REASON FOR CONSULTATION: Possible Crohn's flare up.   HISTORY OF PRESENT ILLNESS:  This is a pleasant 29 year old female with past medical history significant for Crohn's disease of the small bowel, specifically of the terminal ileum, who has previously been seen at the [facility] for this; however, it has been several years. She states that over the past week she began having significant generalized abdominal pain with fevers at home measuring up to 104 degrees. She has also noted increased mucus in her bowel habits, but denies any severe diarrhea or constipation. She denies any bright red blood per rectum or melena. She does state that this has affected her appetite and she has had very little desire to eat. There is accompanying nausea and vomiting as well and she thought she may have seen some specks of blood in most recent emesis. Since being admitted she has been on Tylenol and she has been afebrile; however, blood cultures are pending. She does state that the pain has been unchanged. Her last colonoscopy was in May of 2009, by Dr. Gaylyn Cheers, and she was recommended to repeat it sometime in the spring of 2014. She has been on medications specifically for inflammatory bowel disease including Imuran in the past, however, she decided a few years ago to stop all these medications cold [place]. She also took up smoking as she read that this could potentially help her symptoms. Currently she is not on any medications for inflammatory bowel disease at home. Since being admitted she was started on Solu-Medrol 60 mg q. 6 hours as there was evidence on CT scan of possible early versus mild Crohn's flare of the terminal ileum. There was no free fluid or collection to suggest an abscess  and her appendix appeared normal. There was also moderate fecal material within the colon and therefore she was started on some enemas for bowel care as well. No current fever or chills. No chest pain or  shortness of breath.   PAST MEDICAL HISTORY:  GERD, peptic ulcer disease, and Crohn's disease of the small bowel.   INPATIENT MEDICATIONS:  Zofran, Nexium, methylprednisone 60 mg q. 6 IV, Tylenol, diphenhydramine, morphine, and IV fluids.   PAST SURGICAL HISTORY: Deviated nasal septum and sinus surgery.   HOME MEDICATIONS: Birth control pills, Xanax, and Phenergan as needed. Recently was on antibiotics including amoxicillin for a sinus infection.   SOCIAL HISTORY: The patient does state that she smokes approximately 5 cigarettes per day. She read somewhere that this will help Crohn's disease. She also reports occasional social alcohol use, but denies anything in excess. She denies any illicit drug use.  FAMILY HISTORY: There is no known family history of GI malignancy or IBD. However, she does state that her father had non-gastrointestinal type of cancer.   REVIEW OF SYSTEMS:  Twelve system review was obtained on the patient.  All pertinent positives are mentioned above and otherwise negative.   PHYSICAL EXAMINATION: VITAL SIGNS:  Blood pressure 115/73, heart rate 66, respirations 18, temp 98 with a pulse ox of 98%. GENERAL: This is a pleasant 29 year old female resting quietly and comfortably in bed, alert and oriented x 3, in no acute distress. HEAD:  Atraumatic, normocephalic.  NECK:  Supple.  ENT:  Sclerae anicteric. Mucous membranes moist.  LUNGS:  Respirations are even and unlabored. Clear to auscultation bilaterally in anterior lung fields. HEART:  Regular rate and rhythm. S1 and S2 noted.  ABDOMEN: Soft, nondistended, diffusely tender to palpation without guarding or rebound. Normoactive bowel sounds noted in all 4 quadrants. No signs of an acute abdomen or peritoneal signs. Tattoos  are noted on the abdomen.  RECTAL:  Deferred.  EXTREMITIES: Negative for lower extremity edema. 2+ pulses noted bilaterally.  NEUROLOGIC:  Appropriate mood and affect. Cranial nerves II through XII grossly intact.   LABORATORY DATA: White blood cells 6.4, hemoglobin 13.5, hematocrit 39.8, platelets 146. Sodium 140, potassium 4.1, BUN 5, creatinine 0.80, glucose 138, bilirubin 0.4, alk phos 103, ALT 19, AST 12, albumin 3.7, lipase 150. Blood cultures are pending. Pregnancy test was negative.   IMAGING:  A CAT scan of the abdomen and pelvis was obtained on the patient showing signs of a sequela of early or mild Crohn's flare of the terminal ileum without evidence of free fluid or collection to suggest an abscess. Normal appearing appendix. Moderate fecal material noted within the colon.   ASSESSMENT: 1.  Likely Crohn's flare of the terminal ileum, noted on CT scan.  2.  Constipation.  3.  Generalized abdominal pain likely secondary to #1. 4.  Self-reported fevers at home. Currently been afebrile while inpatient.  5.  Nausea and vomiting with questionable hematemesis. The patient states she has a history of peptic ulcer disease several years ago.   PLAN: I have discussed this patient's case in detail with Dr. Gaylyn Cheers who is involved in the development of the patient's plan of care. At this time, we do agree with the patient being on proton pump inhibitor therapy, which right now she is on IV Nexium.  In regards to her Crohn's flare, we do recommend changing the dose of Solu-Medrol down to 20 mg q. 6 hours IV. We will also start Entocort 9 mg daily. Would also like to obtain a CRP and sed rate to evaluate her inflammatory markers. We recommend continuing symptomatic management with pain control and antiemetics, continuing to monitor hemoglobin and hematocrit as she was suspicious she may have seen some blood in her emesis. We will also initiate Cipro and Flagyl as this may be a an underlying  colitis as she did report a history of fevers. We will also await the findings of the blood cultures. The above plan was discussed with the patient who verbalized understanding and is in agreement. Continue other plans as outlined by internal medicine and other consulting services. All questions were answered.   Thank you so much for this consultation and for allowing [place] to participate in the patient's plan of care.   The above was discussed and agreed upon under supervisoratory agreement between myself and Dr. Gaylyn Cheers. ____________________________ Corky Sox. Earle, PA-C kme:sb D: 01/09/2013 11:26:09 ET    T: 01/09/2013 11:54:48 ET        JOB#: [id number] cc: Corky Sox. Earle, PA-C, <Dictator> [place] PA ELECTRONICALLY SIGNED 01/09/2013 15:18

## 2015-02-18 NOTE — Consult Note (Signed)
Pt with continued pain and tenderness, nausea, vomiting once, some relief with pain shots but it is short.  Her symptoms are impressive and CT findings less so but I have seen this with Crohn's before.  i plan to do EGD and colonoscopy Monday with a Golytely prep.  Clear liq tomorrow.  Abd with tenderness diffusely on my exam, worse in RLQ,  VSS afebrile, hgb stable, plt falling a little.    Electronic Signatures: Manya Silvas (MD)  (Signed on 15-Mar-14 11:07)  Authored  Last Updated: 15-Mar-14 11:07 by Manya Silvas (MD)

## 2015-02-18 NOTE — Consult Note (Signed)
VSS  afebrile, CRP up to 21, some diffuse pain in all quadrants, some consitpation, no results from Miralax yet.  Will give dulcolax supp. Mild discomfort in all areas, no rebound, pt wants to try more diet.  Full liq ordered.  Requests something for sleep,  will give low dose Temazepam.  Electronic Signatures: Manya Silvas (MD)  (Signed on 14-Mar-14 18:47)  Authored  Last Updated: 14-Mar-14 18:47 by Manya Silvas (MD)

## 2015-02-18 NOTE — Consult Note (Signed)
Brief Consult Note: Diagnosis: Mood disorder NOS.   Patient was seen by consultant.   Consult note dictated.   Recommend further assessment or treatment.   Comments: [name] has a h/o depression related to her Crohn's disease. She has been off antidepressants for several years doing well. Last IBD flare six monthe ago, also off medications. She experienced a week of total insomnia. She is exhaused. She came to the ER for a sleeping pill. She is not suicidal or homicidal.  PLAN: 1. Thwe patient no longer meets criteria for IVC. I will terminate proceedings. Please discharge as appropriate.  2. Will give Restoril 15 mg at night. Rx written.  3. She will f/u with me on Wednesday, September 24 at 2:00.  4. Note for work is provided.  Electronic Signatures: Orson Slick (MD)  (Signed 19-Sep-14 14:05)  Authored: Brief Consult Note   Last Updated: 19-Sep-14 14:05 by Orson Slick (MD)

## 2015-02-18 NOTE — Consult Note (Signed)
pt colon and EGD done, see op notes.  Colon with small ulcer in distal rectum, likely stercoral from constipation.  Terminal ileum was normal.  Colon looked normal. She may have severe IBS, wonder about possible GYN problems,  Spoke with hospitalist and suggested GYN consult.  Taper off steroids,  stop antibiotics, switch to oral pain meds.    Electronic Signatures: Manya Silvas (MD)  (Signed on 17-Mar-14 13:27)  Authored  Last Updated: 17-Mar-14 13:27 by Manya Silvas (MD)

## 2015-02-18 NOTE — Consult Note (Signed)
Pt seen, PA did consult which we discussed.  Her most pain is in RUQ and LUQ yet her CT findings are in terminal ileum and right colon.  She has constipation and a hx of IBS.  Abd exam most tender in RUQ and LUQ today on my exam.  I would cut her steroids down to 20 mg q 6 hrs, start cipro and flagyl due to CT findings.Pt not compliant with meds and likely will not be in future.  She smokes because it relieves her IBS pains.  Will follow with you.  No indication at this time for colonoscopy.  Electronic Signatures: Manya Silvas (MD)  (Signed on 13-Mar-14 20:28)  Authored  Last Updated: 13-Mar-14 20:28 by Manya Silvas (MD)

## 2015-02-18 NOTE — Discharge Summary (Signed)
PATIENT NAME:  [name], [name] MR#:  [id number] DATE OF BIRTH:  [birth year]  DATE OF ADMISSION:  01/07/2013 DATE OF DISCHARGE:  01/13/2013  CONSULTANTS:  1.  Gaylyn Cheers, MD - Gastroenterology. 2.  Malachi Paradise, MD - GYN.   CHIEF COMPLAINT: Abdominal pain.  GASTROENTEROLOGIST:  Gaylyn Cheers, MD   DISCHARGE DIAGNOSES: 1.  Irritable bowel syndrome flare.  2.  Gastritis.  3.  Small rectal ulcer.   DISCHARGE MEDICATIONS: 1.  Seasonique oral tablet 1 tab once a day. 2.  Omeprazole 40 mg twice a day. 3.  Prednisone 20 mg for 1 day, then 10 mg for 1 day then stop.  4.  Tramadol 50 mg 1 tab every 6 hours as needed for pain. 5.  Librax 5/2.5 mg 1 cap 3 times a day before meals.   DIET: Regular.   ACTIVITY: As tolerated.   DISCHARGE FOLLOWUP: Please follow up with Dr. Vira Agar within 1 to 2 weeks.   DISPOSITION: Home.   CODE STATUS: FULL CODE.   SIGNIFICANT LABS AND IMAGING: Initial BMP within normal limits. LFTs: AST was 12, otherwise within normal limits. WBC 8.9, hemoglobin 15.7 and platelets 173; last platelets 136. Blood cultures: No growth to date. UA not suggestive of infection. CRP 21. Pregnancy test negative.   CT of abdomen and pelvis with contrast showing possible early or mild Crohn's exacerbation.  Moderate amount of fecal retention within the colon.  HISTORY OF PRESENT ILLNESS AND HOSPITAL COURSE: For full details of H and P, please see the dictation on 01/07/2013 by Dr. Anselm Jungling, but briefly this is a 29 year old female with presumed history of Crohn's and irritable bowel syndrome who came in for worsening abdominal pain. She had some nausea and vomiting as well and some pain in the right upper quadrant. She complained of fever and constipation as well and was admitted to the hospitalist service. Initial CAT scan showed the possibility of early or mild Crohn's. GI was consulted. The patient was seen by Dr. Vira Agar. The patient did not have a fever here and had  negative leukocytosis. She was started on IV fluids, Cipro and Flagyl, and steroids per GI. The patient underwent multiple days of IV fluids and pain medications and complained of significant amount of pain. She also complained of nausea. Diet was changed to clears. The patient underwent colonoscopy and EGD per GI. There was only a small ulcer in the distal rectum, which was biopsied, and there was no evidence of Crohn's. The EGD showed just mild gastritis. At this point, Dr. Vira Agar believes that the symptoms are more of exacerbation of irritable bowel syndrome flare and unlikely to be Crohn's. At this point, the steroids have been tapered and the patient will be discharged on a couple of days of prednisone with stoppage of antibiotics. She will be discharged on Librax. She was also seen by Dr. Enzo Bi, but the note is not back up yet.  The question of PID was raised and at this point it does not appear to be there.  At this point, the patient has no pain, tolerating her diet, has been off of IV fluids, and will be discharged with outpatient followup with Dr. Vira Agar.   TOTAL TIME SPENT: 35 minutes.  ____________________________ Vivien Presto, MD sa:sb D: 01/13/2013 11:15:59 ET T: 01/13/2013 11:33:01 ET JOB#: [id number]  cc: Vivien Presto, MD, <Dictator> Manya Silvas, MD Karel Jarvis [facility] MD ELECTRONICALLY SIGNED 01/20/2013 13:28

## 2015-02-19 NOTE — Consult Note (Signed)
Chief Complaint:  Subjective/Chief Complaint seen for nausea vomiting and abdominal pain.. continues with n/v thugh seems to be improving, continues with abdominalpain in the epigastrum, r+l uq.   VITAL SIGNS/ANCILLARY NOTES: **Vital Signs.:   30-May-15 13:39  Vital Signs Type Routine  Temperature Temperature (F) 98  Temperature Source oral  Pulse Pulse 79  Respirations Respirations 18  Systolic BP Systolic BP 536  Diastolic BP (mmHg) Diastolic BP (mmHg) 70  Mean BP 83  Pulse Ox % Pulse Ox % 100  Oxygen Delivery Room Air/ 21 %  Fetal Heart Tones  150  *Intake and Output.:   Daily 30-May-15 07:00  Emesis ml     Out:  140   Brief Assessment:  Cardiac Regular   Respiratory clear BS   Gastrointestinal details normal Soft  Nondistended  Bowel sounds normal  No gaurding  tender to palpation actoss the epigastrum   Radiology Results: [place]:    28-May-15 19:20, [place] Limited OB  US Limited OB   REASON FOR EXAM:    Pelvic pain, cramping  COMMENTS:   May transport without cardiac monitor    PROCEDURE: [place]  - [place] LIMITED OB  - Mar 25 2014  7:20PM     CLINICAL DATA:  Pelvic pain, cramping    EXAM:  LIMITED OBSTETRIC ULTRASOUND    FINDINGS:  Number of Fetuses: 1    Heart Rate:  149 bpm  Movement: Yes    Presentation: Variable    Placental Location: Anterior and inferior    Previa: Complete    Amniotic Fluid (Subjective):  Within normal limits.    BPD:  2.97cm 15w  3d    MATERNAL FINDINGS:    Cervix:  Appears closed.  Uterus/Adnexae:  No abnormality visualized.     IMPRESSION:  1. Single intrauterine gestational. The fetal heart rate is 149  beats per min. By biparietal diameter, the estimated gestational age  is 6 weeks 3 days.  2. Low lying placenta covers the cervix. This is somewhat  nonspecific at this stage of gestation. Recommend followup  evaluation on routine second trimester imaging.    This exam is performed on an emergent basis and does  not  comprehensively evaluate fetal size, dating, or anatomy; follow-up  complete OB US should be considered if further fetal assessment is  warranted.  Electronically Signed    By: Jacqulynn Cadet M.D.    On: 03/25/2014 19:52         Verified By: Criselda Peaches, M.D.,    28-May-15 21:04, US Abdomen Limited Survey  US Abdomen Limited Survey   REASON FOR EXAM:    RUQ pain, pregnant  COMMENTS:   Body Site: Gallbladder, Liver, Common Bile Duct    PROCEDURE: [place]  - US ABDOMEN LIMITED SURVEY  - Mar 25 2014  9:04PM     CLINICAL DATA:  RIGHT upper quadrant pain, pregnant    EXAM:  US ABDOMEN LIMITED - RIGHT UPPER QUADRANT    COMPARISON:  CT abdomen and pelvis 01/07/2013, ultrasound abdomen  08/25/2009    FINDINGS:  Gallbladder:  Normally distended without stones or wall thickening.    No pericholecystic fluid or sonographic Murphy sign.    Common bile duct:    Diameter: Normal caliber 4 mm diameter    Liver:    Normal appearance. No hepatic mass or intrahepatic biliary  dilatation. Hepatopetal portal venous flow.    No RIGHT upper quadrant free fluid.   IMPRESSION:  Normal exam.  Electronically Signed    By: Lavonia Dana M.D.    On: 03/25/2014 21:36         Verified By: Burnetta Sabin, M.D.,  MRI:    29-May-15 10:27, MRI Abdomen Without Contrast  MRI Abdomen Without Contrast   REASON FOR EXAM:    abd pain, upper, pregnant  COMMENTS:       PROCEDURE: MR  - MR ABDOMEN WO CONTRAST  - Mar 26 2014 10:27AM     CLINICAL DATA:  Fifteen weeks pregnant with upper and lower  abdominal pain plus back pain. Question cholecystitis or  appendicitis.    EXAM:  MRI ABDOMEN AND PELVIS WITHOUT CONTRAST    TECHNIQUE:  Multiplanar multisequence MR imaging of the abdomen and pelvis was  performed. No intravenous contrast was administered.  COMPARISON:  CT scan from 01/07/2013.    FINDINGS:  MRI ABDOMEN AND PELVI FINDINGS    Lung Bases: Unremarkable.    Liver:   Visualized portions are normal.    Spleen: Unremarkable.    Stomach: Decompressed and normal in appearance.    Pancreas: Normal.  No dilatation of the main pancreatic duct.  Gallbladder/Biliary Tree: Normal gallbladder. No gallstones. No  gallbladder wall thickening or pericholecystic fluid. No intra or  extrahepatic biliary duct dilatation.    Kidneys/Adrenals: No adrenal nodule or mass. Kidneys have normal  uninfused appearance without hydronephrosis or perinephric edema.    Bowel Loops: No small bowel dilatation. The terminal ileum is  normal. The appendix is not well seen, but there is no edema or  inflammatory change in the region of the cecal tip. No free fluid in  the right lower quadrant. The colon is diffusely filled with air and  stool.    Nodes: No lymphadenopathy in the abdomen or pelvis.  Vasculature: No abdominal aortic aneurysm.    Pelvic Genitourinary: The bladder is nondistended. Intrauterine  gestation visualize. No evidence for adnexal mass.    Bones/Musculoskeletal: No abnormal marrow signal within the  visualized bony structures.    Body Wall: No evidence for abdominal wall hernia.    Other: Trace free fluid is identified in the peritoneal cavity.  There is a trace amount of posterior midline body wall edema.     IMPRESSION:  No MR findings to explain the patient's history of diffuse abdominal  pain. Specifically, the gallbladder and biliary system has normal  features. Terminal ileum is normal. Although the appendix is not  discretely visualized on this study, there is no edema or  inflammation in the right lower quadrant or around the cecum to  suggest appendicitis.      Electronically Signed    By: Misty Stanley M.D.    On: 03/26/2014 10:46         Verified By: ERIC A. MANSELL, M.D.,    29-May-15 10:27, MRI Pelvis Without Contrast  MRI Pelvis Without Contrast   REASON FOR EXAM:    abd pain, upper, pregnant  COMMENTS:       PROCEDURE: MR   - MR PELVIS WITHOUT CONTRAST  - Mar 26 2014 10:27AM     CLINICAL DATA:  Fifteen weeks pregnant with upper and lower  abdominal pain plus back pain. Question cholecystitis or  appendicitis.    EXAM:  MRI ABDOMEN AND PELVIS WITHOUT CONTRAST    TECHNIQUE:  Multiplanar multisequence MR imaging of the abdomen and pelvis was  performed. No intravenous contrast was administered.  COMPARISON:  CT scan from 01/07/2013.    FINDINGS:  MRI ABDOMEN AND PELVI FINDINGS    Lung Bases: Unremarkable.    Liver:  Visualized portions are normal.    Spleen: Unremarkable.    Stomach: Decompressed and normal in appearance.    Pancreas: Normal.  No dilatation of the main pancreatic duct.  Gallbladder/Biliary Tree: Normal gallbladder. No gallstones. No  gallbladder wall thickening or pericholecystic fluid. No intra or  extrahepatic biliary duct dilatation.    Kidneys/Adrenals: No adrenal nodule or mass. Kidneys have normal  uninfused appearance without hydronephrosis or perinephric edema.    Bowel Loops: No small bowel dilatation. The terminal ileum is  normal. The appendix is not well seen, but there is no edema or  inflammatory change in the region of the cecal tip. No free fluid in  the right lower quadrant. The colon is diffusely filled with air and  stool.    Nodes: No lymphadenopathy in the abdomen or pelvis.  Vasculature: No abdominal aortic aneurysm.    Pelvic Genitourinary: The bladder is nondistended. Intrauterine  gestation visualize. No evidence for adnexal mass.    Bones/Musculoskeletal: No abnormal marrow signal within the  visualized bony structures.    Body Wall: No evidence for abdominal wall hernia.    Other: Trace free fluid is identified in the peritoneal cavity.  There is a trace amount of posterior midline body wall edema.     IMPRESSION:  No MR findings to explain the patient's history of diffuse abdominal  pain. Specifically, the gallbladder and biliary system has  normal  features. Terminal ileum is normal. Although the appendix is not  discretely visualized on this study, there is no edema or  inflammation in the right lower quadrant or around the cecum to  suggest appendicitis.      Electronically Signed    By: Misty Stanley M.D.    On: 03/26/2014 10:46         Verified By: ERIC A. MANSELL, M.D.,   Assessment/Plan:  Assessment/Plan:  Assessment 1) epigastric, ruq luq abdominal apin with n/v. now on prevacid po.  imaging studies uninformative,  of note there does not seem to be any indication of h/o crohns/IBDz.   Plan 1) continue current, zofran seems to be more effective for her than the phenergan.   Patient also relating some itching with morphine.  I have discussed with nursing.  following.   Electronic Signatures: Loistine Simas (MD)  (Signed 30-May-15 15:14)  Authored: Chief Complaint, VITAL SIGNS/ANCILLARY NOTES, Brief Assessment, Radiology Results, Assessment/Plan   Last Updated: 30-May-15 15:14 by Loistine Simas (MD)

## 2015-02-19 NOTE — Consult Note (Signed)
Chief Complaint:  Subjective/Chief Complaint Patient seen and examined, please see full GI consult and brief consult note for more detail.   Patient admitted with n/v and c/o abdominalpain.  Currently doing better on zofran for nausea.  Recommend using prevacid 30 mg po daily for gastitis/gastric prophylaxis as will likely be more effective than H2RA.   Under current clinical situation cannot pursue further evaluation of possible Crohns disease with SBS/Sedated proceedures etc, but recommend further evaluation once post partum.  Will obtain IBD serologic panel.  Also, she is due to be seen at [facility] in referral from her primary GI Dr Vira Agar and I recommend her keeping that appointment.  Following.   Electronic Signatures: Loistine Simas (MD)  (Signed 29-May-15 19:40)  Authored: Chief Complaint   Last Updated: 29-May-15 19:40 by Loistine Simas (MD)

## 2015-02-19 NOTE — Consult Note (Signed)
Brief Consult Note: Diagnosis: recurrent nausea vomiting, ruq pain in gravida 23 weeks.   Patient was seen by consultant.   Consult note dictated.   Recommend further assessment or treatment.   Orders entered.   Discussed with Attending MD.   Comments: Please see full GI consult [phone].  Patietn presenting with recurrent n/v and ruq pain.  Previously seen in late may 2015 for similar presentation.   She has been doing much better in terms of reflux, but experiancing exacerbation of sx as above.  Labs uninformative, recommend 10 day trial of bid prevacid, then return to once daily.   Abd [place]  re ruq pain this week.  She is to keep appointment with [facility] GI for second opinion on Crohns disease, and I will be happy to see heer in about 10 d as o/p for GI fu as well.  Thank you for this consult.  Electronic Signatures: Loistine Simas (MD)  (Signed 19-Jul-15 18:39)  Authored: Brief Consult Note   Last Updated: 19-Jul-15 18:39 by Loistine Simas (MD)

## 2015-02-19 NOTE — Consult Note (Signed)
Chief Complaint:  Subjective/Chief Complaint seen for n/v abdominalpain.  continuing to haved n, less emesis, continues with epigastric and upper r+luq pain to palpation.  no bm since admission.   VITAL SIGNS/ANCILLARY NOTES: **Vital Signs.:   31-May-15 12:04  Vital Signs Type Routine  Temperature Temperature (F) 97.8  Celsius 36.5  Temperature Source oral  Pulse Pulse 79  Respirations Respirations 18  Systolic BP Systolic BP 91  Diastolic BP (mmHg) Diastolic BP (mmHg) 57  Mean BP 68  Pulse Ox % Pulse Ox % 98  Oxygen Delivery Room Air/ 21 %  Fetal Heart Tones  150  *Intake and Output.:   Daily 31-May-15 07:00  Emesis ml     Out:  250    18:44  Emesis ml     Out:  3    Shift 23:00  Emesis ml     Out:  3   Brief Assessment:  Cardiac Regular   Respiratory clear BS   Gastrointestinal details normal Soft  Nondistended  Bowel sounds normal  No rebound tenderness  tender upper abdomen mostly close to the edge of ribcage.   Lab Results: Hepatic:  31-May-15 02:17   Bilirubin, Total 0.3  Alkaline Phosphatase 53 (45-117 NOTE: New Reference Range 09/18/13)  SGPT (ALT) 16  SGOT (AST)  43  Total Protein, Serum  5.7  Albumin, Serum  2.6  Routine Chem:  31-May-15 02:17   Glucose, Serum 82  BUN  3  Creatinine (comp) 0.62  Sodium, Serum 137  Potassium, Serum 3.5  Chloride, Serum  108  CO2, Serum 24  Calcium (Total), Serum  7.8  Osmolality (calc) 269  eGFR (African American) >60  eGFR (Non-African American) >60 (eGFR values <60m/min/1.73 m2 may be an indication of chronic kidney disease (CKD). Calculated eGFR is useful in patients with stable renal function. The eGFR calculation will not be reliable in acutely ill patients when serum creatinine is changing rapidly. It is not useful in  patients on dialysis. The eGFR calculation may not be applicable to patients at the low and high extremes of body sizes, pregnant women, and vegetarians.)  Anion Gap  5  Routine Hem:   31-May-15 02:17   WBC (CBC) 8.3  RBC (CBC) 3.85  Hemoglobin (CBC)  11.8  Hematocrit (CBC) 35.9  Platelet Count (CBC)  120  MCV 93  MCH 30.6  MCHC 32.8  RDW 13.3  Neutrophil % 74.6  Lymphocyte % 17.7  Monocyte % 6.8  Eosinophil % 0.7  Basophil % 0.2  Neutrophil # 6.2  Lymphocyte # 1.5  Monocyte # 0.6  Eosinophil # 0.1  Basophil # 0.0 (Result(s) reported on 28 Mar 2014 at 02:54AM.)   Radiology Results: [place]    28-May-15 21:04, [place]  [place] Survey   REASON FOR EXAM:    RUQ pain, pregnant  COMMENTS:   Body Site: Gallbladder, Liver, Common Bile Duct    PROCEDURE: [place] - [place] LIMITED SURVEY  - Mar 25 2014  9:04PM     CLINICAL DATA:  RIGHT upper quadrant pain, pregnant    EXAM:  [place] LIMITED - RIGHT UPPER QUADRANT    COMPARISON:  CT abdomen and pelvis 01/07/2013, ultrasound abdomen  08/25/2009    FINDINGS:  Gallbladder:  Normally distended without stones or wall thickening.    No pericholecystic fluid or sonographic Murphy sign.    Common bile duct:    Diameter: Normal caliber 4 mm diameter    Liver:  Normal appearance. No hepatic mass or intrahepatic biliary  dilatation. Hepatopetal portal venous flow.    No RIGHT upper quadrant free fluid.   IMPRESSION:  Normal exam.      Electronically Signed    By: Lavonia Dana M.D.    On: 03/25/2014 21:36         Verified By: Burnetta Sabin, M.D.,  MRI:    29-May-15 10:27, MRI Abdomen Without Contrast  MRI Abdomen Without Contrast   REASON FOR EXAM:    abd pain, upper, pregnant  COMMENTS:       PROCEDURE: MR  - MR ABDOMEN WO CONTRAST  - Mar 26 2014 10:27AM     CLINICAL DATA:  Fifteen weeks pregnant with upper and lower  abdominal pain plus back pain. Question cholecystitis or  appendicitis.    EXAM:  MRI ABDOMEN AND PELVIS WITHOUT CONTRAST    TECHNIQUE:  Multiplanar multisequence MR imaging of the abdomen and pelvis was  performed. No intravenous contrast was  administered.  COMPARISON:  CT scan from 01/07/2013.    FINDINGS:  MRI ABDOMEN AND PELVI FINDINGS    Lung Bases: Unremarkable.    Liver:  Visualized portions are normal.    Spleen: Unremarkable.    Stomach: Decompressed and normal in appearance.    Pancreas: Normal.  No dilatation of the main pancreatic duct.  Gallbladder/Biliary Tree: Normal gallbladder. No gallstones. No  gallbladder wall thickening or pericholecystic fluid. No intra or  extrahepatic biliary duct dilatation.    Kidneys/Adrenals: No adrenal nodule or mass. Kidneys have normal  uninfused appearance without hydronephrosis or perinephric edema.    Bowel Loops: No small bowel dilatation. The terminal ileum is  normal. The appendix is not well seen, but there is no edema or  inflammatory change in the region of the cecal tip. No free fluid in  the right lower quadrant. The colon is diffusely filled with air and  stool.    Nodes: No lymphadenopathy in the abdomen or pelvis.  Vasculature: No abdominal aortic aneurysm.    Pelvic Genitourinary: The bladder is nondistended. Intrauterine  gestation visualize. No evidence for adnexal mass.    Bones/Musculoskeletal: No abnormal marrow signal within the  visualized bony structures.    Body Wall: No evidence for abdominal wall hernia.    Other: Trace free fluid is identified in the peritoneal cavity.  There is a trace amount of posterior midline body wall edema.     IMPRESSION:  No MR findings to explain the patient's history of diffuse abdominal  pain. Specifically, the gallbladder and biliary system has normal  features. Terminal ileum is normal. Although the appendix is not  discretely visualized on this study, there is no edema or  inflammation in the right lower quadrant or around the cecum to  suggest appendicitis.      Electronically Signed    By: Misty Stanley M.D.    On: 03/26/2014 10:46         Verified By: ERIC A. MANSELL, M.D.,    29-May-15  10:27, MRI Pelvis Without Contrast  MRI Pelvis Without Contrast   REASON FOR EXAM:    abd pain, upper, pregnant  COMMENTS:       PROCEDURE: MR  - MR PELVIS WITHOUT CONTRAST  - Mar 26 2014 10:27AM     CLINICAL DATA:  Fifteen weeks pregnant with upper and lower  abdominal pain plus back pain. Question cholecystitis or  appendicitis.    EXAM:  MRI ABDOMEN AND PELVIS WITHOUT CONTRAST  TECHNIQUE:  Multiplanar multisequence MR imaging of the abdomen and pelvis was  performed. No intravenous contrast was administered.  COMPARISON:  CT scan from 01/07/2013.    FINDINGS:  MRI ABDOMEN AND PELVI FINDINGS    Lung Bases: Unremarkable.    Liver:  Visualized portions are normal.    Spleen: Unremarkable.    Stomach: Decompressed and normal in appearance.    Pancreas: Normal.  No dilatation of the main pancreatic duct.  Gallbladder/Biliary Tree: Normal gallbladder. No gallstones. No  gallbladder wall thickening or pericholecystic fluid. No intra or  extrahepatic biliary duct dilatation.    Kidneys/Adrenals: No adrenal nodule or mass. Kidneys have normal  uninfused appearance without hydronephrosis or perinephric edema.    Bowel Loops: No small bowel dilatation. The terminal ileum is  normal. The appendix is not well seen, but there is no edema or  inflammatory change in the region of the cecal tip. No free fluid in  the right lower quadrant. The colon is diffusely filled with air and  stool.    Nodes: No lymphadenopathy in the abdomen or pelvis.  Vasculature: No abdominal aortic aneurysm.    Pelvic Genitourinary: The bladder is nondistended. Intrauterine  gestation visualize. No evidence for adnexal mass.    Bones/Musculoskeletal: No abnormal marrow signal within the  visualized bony structures.    Body Wall: No evidence for abdominal wall hernia.    Other: Trace free fluid is identified in the peritoneal cavity.  There is a trace amount of posterior midline body wall edema.      IMPRESSION:  No MR findings to explain the patient's history of diffuse abdominal  pain. Specifically, the gallbladder and biliary system has normal  features. Terminal ileum is normal. Although the appendix is not  discretely visualized on this study, there is no edema or  inflammation in the right lower quadrant or around the cecum to  suggest appendicitis.      Electronically Signed    By: Misty Stanley M.D.    On: 03/26/2014 10:46         Verified By: ERIC A. MANSELL, M.D.,   Assessment/Plan:  Assessment/Plan:  Assessment 1) n/v/epigastric pain-imaging ununformative.  possible inflammation of the edge of the lowere ribs/strenum from recurrent emesis/retching.   Plan 1) continue current.  chart reviewed-h pylori checked multiple times n the past by bx, negative, agree with serology.  following.   Electronic Signatures: Loistine Simas (MD)  (Signed 31-May-15 19:47)  Authored: Chief Complaint, VITAL SIGNS/ANCILLARY NOTES, Brief Assessment, Lab Results, Radiology Results, Assessment/Plan   Last Updated: 31-May-15 19:47 by Loistine Simas (MD)

## 2015-02-19 NOTE — Consult Note (Signed)
Brief Consult Note: Diagnosis: abdominal pain, NV.   Patient was seen by consultant.   Consult note dictated.   Comments: Appreciate cx for 29 y/o cauc woman 15w preg (G5P0) for evaluation of abdominal pain, NV. Had unremarkable MRI of abdomen/pelvis and normal [place]. CBC/Metc unremarkable. On IVF/antiemetics/pain meds/H2RA. Says she is not feeling any better. Gives hx of Crohns disease for the last 46yr: now in remiss based on diet changes, not on any current meds or following with GI for this. Chart review reveals history of recurrent abdominal pain and IBS. Has had several colonoscopies and EGDs in 2007, 2009, and 2014 with findings signifcant for one gastric ulcer (2007), gastritis (2009, 2014) without H pylori. Colonoscopies revealed normal TI (2007) with some pseudomembranes in the rectum- favored infectious cause on path report.  2009 revealed hemorrhoids, normal ileocecal valve but mild erythema in the distal ileum: bx demonstrated focal active ileitis in one gland only: there were no chronic changes. 2014 procedure revealed a normal appearing colon except for single ulcer to rectum that showed active colitis without chronic changes/architecture distortion.  In further review, she was evaluated by Dr Suanne Marker at [facility] in 2010 for a 2nd opinion on her Crohns; ev. 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 Started on some despiramine at that time, which seemed to help but then stopped. States after that she felt like she could deal with her GI issues herself, and stopped following regularly with GI.  States that her current sx started yesterday, denies dietary changes and sick contacts. States that her pain is primarily in the epigastrum/ luq/ruq,  persistent NV throughoutpregnancy.  Describes vomit as bilious.  States she saw a blackish stool last week and a white solid  stool yesterday. Unable to describe symptoms further. Denies NSAIDs. Not on PPI  or H2RA at home.   Impression and plan. Epigastric abdominal pain, NV. Multifactorial- IBD panel, recommend changing to prevacid 30mg  daily and tx sx. Would like SBS once postp..  Electronic Signatures: Theodore Demark (NP)  (Signed 29-May-15 18:54)  Authored: Brief Consult Note   Last Updated: 29-May-15 18:54 by Theodore Demark (NP)

## 2015-02-19 NOTE — Consult Note (Signed)
PATIENT NAME:  [name], [name] MR#:  [id number] DATE OF BIRTH:  Jan 25, 1986  DATE OF CONSULTATION:  05/16/2014  REFERRING PHYSICIAN:  Alanda Slim. DeFrancesco, MD CONSULTING PHYSICIAN:  Lollie Sails, MD  REASON FOR CONSULTATION: Possible Crohn's disease flare-up.   HISTORY OF PRESENT ILLNESS: [name] is a 29 year old Caucasian female with whom I am familiar. I did a GI consultation for her on 03/26/2014 in regards to abdominal pain, nausea and vomiting. At that time, she was gravida 15 weeks and slowly improved over the course of the next week by the institution of Zofran for nausea as well as instituting a proton pump inhibitor. She had been doing fairly well until last night. Last night, she began to have a lot more right upper quadrant discomfort then followed by multiple times of emesis. The pain increased and she got in touch with her GYN. She is now in observation. She describes the pain as being mostly right upper quadrant. There is no true radiation to the back or under the scapula. There has been no heartburn or dysphagia as she is continuing on the Prevacid 30 mg daily. She has a bowel movement currently daily which has also been helped by using MiraLax and stool softeners on a regular basis.   The patient has a history of having been diagnosed with possible Crohn's disease going back to 2007. She has had multiple EGDs and colonoscopies, these being in 2007, 2009 and 2014. On the upper scopes she has had a finding of gastritis. These have been biopsy negative for Helicobacter pylori. On colonoscopies, these being done 01/12/2013, 05/26/009 and 03/24/2006, there was no evidence of actual colitis. On 1 biopsy following an apparently abnormal CT scan  that was done on 02/16/2008, there was a slight focus of ileitis on biopsy. The CT scan on that date had been read as terminal ileitis with thickening of the terminal ileum. It is of note that the biopsies were taken about 3 weeks thereafter. She also  had a repeat CT scan on 08/17/2008 that showed no change or no disease in the terminal ileum. She had been trialed on different medications in regards to the possibility of Crohn's, did not tolerate them, stopped them and has not taken them for many years. She does have an appointment for an opinion in regards to possible Crohn's disease at [facility] on July 28th. The patient currently is gravid at 23 weeks with estimated date of confinement of 09/13/2014. She did have a bout on her initial presentations in 2007 with Salmonella. 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 Currently, she is relatively stable. She has been using Phenergan as well as Diclegis as outpatient medications to help manage her nausea during this pregnancy. In review, when I last saw her in late May 2015, I did obtain an abdominal ultrasound because of right upper quadrant pain at that time. This showed the gallbladder to be normally distended without stones or wall thickening. There was no pericholecystic fluid or sonographic Murphy's sign. The common bile duct was normal dimension at 4 mm. The liver itself showed a normal appearance, no hepatic mass or intrahepatic biliary dilatation and a normal hepatopetal portal venous flow. Normal exam.   PAST MEDICAL HISTORY: The patient has had sinus surgery in the past, history of migraines, history of recurrent abdominal pain and nausea as well as irritable bowel syndrome. She has a history of thyroid disorder. From her GYN standpoint/OB/GYN standpoint, she has had  4 miscarriages in the first trimester.   FAMILY HISTORY:  Negative for colorectal cancer, liver disease or ulcers. Her mother has diabetes. Her father has heart disease. Her sister has schizophrenia.   PHYSICAL EXAMINATION: VITAL SIGNS: Temperature 98.4, respirations 16, pulse 83, blood pressure 104/63.  GENERAL: She is a well-appearing 29 year old Caucasian female, tired, fatigued in  appearance.  HEENT: Normocephalic, atraumatic.  EYES: Anicteric.  NOSE: Septum midline. No lesions.  OROPHARYNX: No lesions.  NECK: Supple. No JVD.  HEART: Regular rate and rhythm without rub or gallop.  LUNGS: Bilaterally clear.  ABDOMEN:  Gravid. She has some mild to moderate tenderness in the right upper quadrant. This extends toward the midline in the epigastrium. There is no rebound. There is some mild tenderness to palpation in the lower abdomen.   LABORATORY, DIAGNOSTIC AND RADIOLOGICAL DATA:  Hepatic profile showing a total protein of 5.6, albumin 2.6, total bilirubin 0.1, direct bilirubin less than 0.1, alkaline phosphatase 61, AST 15, ALT 13. Hemogram showing white count of 11.3, hemoglobin and hematocrit 11.1/34.4, platelet count of 138, MCV is 93. Urinalysis shows 30 mg/dL protein and 1+ leukocyte esterase with 7 white cells per high-power field. I do not think this was a catheterized specimen.   ASSESSMENT: Recurrent epigastric right upper quadrant pain, nausea and vomiting. The patient has a history of irritable bowel syndrome and a diagnosis of Crohn's disease. I have reviewed her chart extensively today. I am a bit skeptical of the diagnosis of Crohn's disease. This can be traced back to a time when she had an infected enteritis. She subsequently also experienced some problems with irritable bowel syndrome-type symptoms. Multiple esophagogastroduodenoscopies and colonoscopies since that time have not verified by biopsy actual Crohn's disease. When I last saw her in May, I did obtain an IBD-7 Crohn's diagnostic panel/inflammatory bowel disease panel that showed no supportive evidence for inflammatory bowel disease.   RECOMMENDATIONS: 1.  Case discussed with Dr. Enzo Bi. I would recommend doing her liver-associated enzymes today with blood that is in the lab. That has been done, results as above, normal. Further, I would recommend repeating her ultrasound at some point this week.   2.  Would increase her Prevacid to twice daily for about 10 days and then back down to once a day. Continue using antiemetics as possible. She seemed to do better with Zofran the last time she was in the hospital. The patient is somewhat reticent to use this regularly due to possible side effect/complication problems.  3.  I would like to see her back in the office in about 10 days or so. She should keep her  appointment at [facility] gastroenterology for a second opinion in regards to the Crohn's disease.   Thank you for this consult.   ____________________________ Lollie Sails, MD mus:cs D: 05/16/2014 18:35:02 ET T: 05/16/2014 19:23:37 ET JOB#: [id number]  cc: Lollie Sails, MD, <Dictator> Lollie Sails MD ELECTRONICALLY SIGNED 05/27/2014 17:39 Lollie Sails MD ELECTRONICALLY SIGNED 05/27/2014 17:44

## 2015-02-19 NOTE — Consult Note (Signed)
Chief Complaint:  Subjective/Chief Complaint seen for n/v upper abdominal  pain.  a little better today, no emesis, continued GERD, mild nausea, less abdominal pain. no bm, passing flatus.   VITAL SIGNS/ANCILLARY NOTES: **Vital Signs.:   01-Jun-15 15:48  Vital Signs Type Routine  Temperature Temperature (F) 98  Temperature Source oral  Pulse Pulse 76  Respirations Respirations 20  Systolic BP Systolic BP 99  Diastolic BP (mmHg) Diastolic BP (mmHg) 63  Mean BP 75  BP Source  if not from Vital Sign Device non-invasive   Brief Assessment:  Cardiac Regular   Respiratory clear BS   Gastrointestinal details normal Soft  Nondistended  Bowel sounds normal  No rebound tenderness  mild upper epigastric pain to palpation, less so luq/ruq   Assessment/Plan:  Assessment/Plan:  Assessment 1) abdominal pain, n/v.  slow improvement, etiology uncertain, likely n/v of pregnancy. abdominal pain possibly muscle wall related to recurrent emesis.   Plan 1) continue ppi, will add some sherbert.   Electronic Signatures: Loistine Simas (MD)  (Signed 01-Jun-15 17:03)  Authored: Chief Complaint, VITAL SIGNS/ANCILLARY NOTES, Brief Assessment, Assessment/Plan   Last Updated: 01-Jun-15 17:03 by Loistine Simas (MD)

## 2015-02-19 NOTE — Consult Note (Signed)
Chief Complaint:  Subjective/Chief Complaint seen for nausea and vomiting and abdominalpain.  tolerating soem food items, some nausea no emesis today reflux improving.   VITAL SIGNS/ANCILLARY NOTES: **Vital Signs.:   02-Jun-15 16:35  Vital Signs Type Routine  Temperature Temperature (F) 98.2  Temperature Source oral  Pulse Pulse 65  Respirations Respirations 18  Systolic BP Systolic BP 96  Diastolic BP (mmHg) Diastolic BP (mmHg) 57  Mean BP 70  Pulse Ox % Pulse Ox % 98  Pulse Ox Activity Level  At rest  Oxygen Delivery Room Air/ 21 %   Brief Assessment:  Cardiac Regular   Respiratory normal resp effort   Gastrointestinal details normal Soft  Nondistended  Bowel sounds normal  mild epigastric discomfort   Assessment/Plan:  Assessment/Plan:  Assessment 1) n/v abdominal pain-improving on antiemetics and treatemtn of GERD/gastritis with ppi.   Plan 1) continue current. will follow at a distance-continue prevacid 30 mg daily thro0ughout the pregnancy, continue until GI fu postpartum.   Electronic Signatures: Loistine Simas (MD)  (Signed 02-Jun-15 19:02)  Authored: Chief Complaint, VITAL SIGNS/ANCILLARY NOTES, Brief Assessment, Assessment/Plan   Last Updated: 02-Jun-15 19:02 by Loistine Simas (MD)

## 2015-02-19 NOTE — Op Note (Signed)
PATIENT NAME:  [name], CHERVENAK MR#:  [id number] DATE OF BIRTH:  [birth year]  DATE OF PROCEDURE:  06/11/2014  PREOPERATIVE DIAGNOSES:  1.  Crohn's disease.  2.  Twenty-five weeks gestational age.  3.  Lack of IV access/complication of vascular device.  POSTOPERATIVE DIAGNOSES:  1.  Crohn's disease.  2.  Twenty-five weeks gestational age.  3.  Lack of IV access/complication of vascular device.  PROCEDURE PERFORMED: Insertion of right internal jugular Infuse-a-Port with ultrasound and fluoroscopic guidance.   SURGEON: Katha Cabal, M.D.   SEDATION: Fentanyl 200 mg plus Versed 1 mg given IV.  Continuous ECG, pulse oximetry, and cardiopulmonary monitoring was performed throughout the entire procedure by the interventional radiology nurse. Total sedation time was approximately 50 minutes.   ACCESS: Right IJ.   CONTRAST USED: None.   FLUOROSCOPY TIME: 0.1 minutes.   INDICATIONS: Ms. Grater is a 29 year old woman who is now [redacted] weeks pregnant. She has been diagnosed with Crohn's disease/inflammatory bowel disease since the age of 77, and this has been very difficult to control requiring multiple hospitalizations throughout her life as recently as 2 weeks ago. Because of her pregnancy in combination with her inflammatory bowel disease, she is considered very high risk and has been undergoing multiple procedures and/or requiring parenteral medications. She has had a very unfortunate experience with intravenous placements including multiple problems with PICC lines and at the request of both her gynecologist/obstetrician here, as well as her GI doctors and obstetricians at [facility] she is consented to undergo port placement. Risks and benefits were reviewed including the discussion regarding radiation exposure to the fetus, as well as the utilization of above narcotics and benzodiazepines in an attempt to avoid general anesthesia during her third trimester. All questions have been answered. She acknowledges  the risks, but also understands the importance of having adequate IV access for all of her needs. She wishes to proceed.   DESCRIPTION OF PROCEDURE: The patient is taken to special procedures and placed in supine position. She is raised at the hip and abdominal level by placing blanket rolls under her flank on the right side to allow the uterus to fall to the left. She is given nasal cannula oxygen in spite of her 100% saturations on room air given her young age. She is positioned with her neck slightly extended and rotated to the left. Neck is then prepped and draped in a sterile fashion. An appropriate timeout is called.   Beginning at 15 microgram doses, fentanyl was utilized. After 28 doses, she is given a small dose of Versed as she was still quite anxious and was voicing concerns for anxiety symptoms. Ultrasound was utilized to examine the jugular vein, which was echolucent and compressible. 1% lidocaine was then infiltrated into the soft tissues at the base of the neck, as well as on the chest wall. A total of 35 mL of lidocaine was utilized.   Micropuncture needle was then inserted into the jugular vein under direct ultrasound visualization. Jugular vein was echolucent and compressible indicating patency. Images recorded for the permanent record. Microwire was then advanced, and a very brief spot of fluoroscopy was utilized with the machine set to its lowest pulse setting, that is 1 pulse per second. This confirmed wire placement.   A linear incision was then created 2 fingerbreadths below the clavicle and pocket fashioned using both blunt and sharp dissection. Pocket was tested for size with the hub. The catheter was then pulled subcutaneously. Small counterincision was made at  the wire insertion site and again a small pocket fashioned with blunt dissection. The catheter is pulled through. Micro sheath is then inserted over the micro wire. Then the J-wire is advanced and again a spot of fluoroscopy  is performed to verify wire placement. Dilator/ peel-away sheath is advanced, and the wire and dilator are removed. The catheter is then advanced through the peel-away sheath and the peel-away sheath is removed. The catheter is then checked under fluoroscopy for appropriate tip position, which is then placed at the atriocaval junction. The catheter is transected. The hub connected. Slipped into the pocket and then it is accessed percutaneously with a Huber needle. Flushes and aspirates quite easily. Prior to closing of the pocket with the port sitting in its position without any interference, 1 single spot x-ray is then obtained showing the catheter in excellent position at its tip and smooth in contour throughout its course. The pocket was then closed in two layers using interrupted 3-0 Vicryl for the subcutaneous tissues followed by 4-0 Monocryl subcuticular and Dermabond. 4-0 Monocryl subcuticular and Dermabond is then used to close the neck counterincision. The patient tolerated the procedure well. There were no immediate complications. Sponge and needle counts were correct and she was taken to the recovery area in excellent condition.     ____________________________ Katha Cabal, MD ggs:am D: 06/11/2014 17:47:28 ET T: 06/12/2014 03:34:24 ET JOB#: [id number]  cc: Katha Cabal, MD, <Dictator> Alanda Slim. DeFrancesco, MD Katha Cabal MD ELECTRONICALLY SIGNED 07/06/2014 22:34

## 2015-02-19 NOTE — Consult Note (Signed)
PATIENT NAME:  [name], [name] MR#:  [id number] DATE OF BIRTH:  November 02, 1985  DATE OF CONSULTATION:  03/26/2014  REFERRING PHYSICIAN:  Dr. Marcelline Mates CONSULTING PHYSICIAN:  Theodore Demark, NP  REASON FOR CONSULTATION:  Evaluation of abdominal pain, nausea, vomiting.  Appreciate consult for 29 year old Caucasian woman [redacted] weeks pregnant G5, P0 for evaluation of abdominal pain, nausea, vomiting. Had remarkable MRI of abdomen and pelvis today, normal ultrasound today.  Findings unremarkable.  She is on IV fluids, antiemetics, pain meds, H2RA therapy. States she is really not feeling any better. Gives history of Crohn disease over the last 8 years, now in remission based on diet changes; not on any current medications or following with GI for this.  Chart review reveals history of recurrent abdominal pain and IBS. Has had several colonoscopies and EGDs in 2007, 2009, 2014 with findings significant for wound, gastric ulcer 2007, gastritis 2009, 2014 without H. pylori. Colonoscopies revealed normal TI in 2007 with some pseudomembranes in the rectum.  Path report favored infectious cause. 2009 colonoscopy revealed hemorrhoids, normal ileocecal valve, mild erythema. In the distal ileum, biopsy demonstrated focal active ileitis.  There were no chronic changes. 2014 procedure revealed normal-appearing colon except for single biopsy to  rectum that showed active colitis without chronic changes or architectural distortion.  In further review, she was evaluated by [facility]. Francisca December at [facility] in 2010 for second opinion on Crohn's. Evidently, he thought she did not have Crohn's but did have some post infectious IBS from Salmonella infection 2006; started on some desipramine at that time which seemed to help, but then stopped. States after she felt like she could deal with her GI issues herself and stop following regularly with GI.  States that her current symptoms started yesterday. Denies any dietary changes, sick contacts.  States that her pain is primarily in the epigastrium, left upper quadrant, right upper quadrant and has had persistent nausea and vomiting throughout pregnancy. Describes her vomit as bilious. States she saw blackish stool last week and a white solid stool at home yesterday, unable to describe symptoms further.  She denies NSAID use. She is not on PPI or H2RA at home at this time. Last menstrual period 12/17/2013.   PAST MEDICAL HISTORY: Migraines, IBS, recurrent abdominal pain, nausea, vomiting of pregnancy, thyroid disorder, 4 miscarriages in first trimester, questionable history of Crohn's. Has had sinus surgery.   HOME MEDICATIONS: Phenergan 25 mg, Diclegis.  ALLERGIES: IMITREX, DEMEROL, DILAUDID.  SOCIAL HISTORY: Smoke cigarettes. No alcohol since pregnancy but did drink some prior to pregnancy, attempting to quit tobacco now.   FAMILY HISTORY: Significant with schizophrenia in her sister. Diabetes mother. Father heart disease.  REVIEW OF SYSTEMS:  Ten systems reviewed. Significant for general malaise; otherwise, unremarkable. Specifically has had no weight loss.   Glucose 83, BUN 5, creatinine 0.63. Sodium 139, potassium 3.8, GFR greater than 60, lipase 159, beta-hCG 35,605; calcium 8.8, total protein 6.8, albumin 3.3, total bilirubin 0.1, ALP 61, AST 21, ALT 11, WBC 9.5, hemoglobin 13.5, hematocrit 40.3, platelet count 140. Red cells are normocytic with normal RDW.  Ultrasound of the abdomen without findings of cholecystitis. Common bile duct was normal in diameter. Liver was normal in appearance. No mass, ductal dilation. Blood flow was normal. Did have pelvic ultrasound yesterday with single intrauterine gestation.  The fetal heart rate was 149, gestational age [redacted] weeks 3 days, low-lying placenta.  Abdominal/pelvic MRI with no findings to explain the patient's history of pain; specifically, gallbladder, biliary system  has normal features. Terminal ileum normal, although the appendix was  not clearly visualized, there was no edema or inflammation in the right lower quadrant or around the cecum to suggest appendicitis.   PHYSICAL EXAMINATION: VITAL SIGNS: Most recent: Temperature 98.2, pulse 78, respiratory rate 20, blood pressure 103/68, SaO2 96% on room air.  GENERAL: Somewhat disheveled young woman resting in bed in no acute distress.  HEENT: Normocephalic. Sclerae anicteric. Conjunctivae pink.  NECK: Supple. No JVD.  CHEST: Respirations eupneic. Lungs clear bilaterally.  CARDIAC: S1, S2, RRR. No MRG. No appreciable edema.  ABDOMEN: Nondistended. Bowel sounds x 4. Tenderness noted to the epigastric, right upper and left upper medial quadrants. Soft. No rigidity, guarding, peritoneal signs, hepatosplenomegaly, rebound, tenderness or other abnormalities.  SKIN: Warm, dry, pink. No erythema, lesion or rash. Does have multiple tattoos.  EXTREMITIES: Strength 5/5. Sensation intact. Moves well.  NEUROLOGICAL: Alert, oriented x 3. Cranial nerves II through XII intact. Speech clear. No facial droop.  PSYCHIATRIC: Cooperative, calm.   IMPRESSION AND PLAN: Epigastric abdominal pain, nausea, vomiting, history of irritable bowel syndrome, recurrent abdominal pain, skeptical Crohn's diagnosis. Her current episode is likely multifactorial. She has had quite a bit of nausea and vomiting with this pregnancy. She may have some musculoskeletal soreness that contributes to this. Recommend treating her symptoms with her history of gastritis and also recommend changing her H2RA to Prevacid 30 mg by mouth daily. Will assess inflammatory bowel disease panel once she is postpartum. Would like a small bowel study on her to aid with diagnosis of inflammatory bowel versus irritable bowel syndrome. Recommend continuing to treat symptoms.  Thank you very much for this consult.  These services were provided by Stephens November, MSN, [facility], in collaboration with Loistine Simas, MD, with whom I have discussed  this patient in full.    ____________________________ Theodore Demark, NP chl:ce D: 03/26/2014 19:03:33 ET T: 03/26/2014 20:15:54 ET JOB#: [id number]  cc: Theodore Demark, NP, <Dictator> [place] SIGNED 04/07/2014 14:29

## 2015-02-26 ENCOUNTER — Ambulatory Visit
Admit: 2015-02-26 | Disposition: A | Discharge status: Home / Self Care | Payer: Self-pay | Attending: Family Medicine | Admitting: Family Medicine

## 2015-02-26 LAB — RAPID STREP-A WITH REFLX: Micro Text Report: NEGATIVE

## 2015-02-27 NOTE — Op Note (Signed)
PATIENT NAME:  [name], FREEHLING MR#:  [id number] DATE OF BIRTH:  February 16, 1986  DATE OF PROCEDURE:  02/01/2015  PREOPERATIVE DIAGNOSES:  1.  Hyperemesis gravidarum.  2.  Crohn disease.   POSTOPERATIVE DIAGNOSES:  1.  Hyperemesis gravidarum, status post successful delivery.  2.  Lack of appropriate intravenous access for workup of inflammatory bowel disease.  3.  Complication of vascular device with inability to withdraw blood from Infuse-a-Port.   PROCEDURES PERFORMED:  1.  Contrast injection of right internal jugular Infuse-a-Port.  2.  Infusion tissue plasminogen activator, right internal jugular Infuse-a-Port.   SURGEON: Katha Cabal, MD    SEDATION: None.   FLUOROSCOPY TIME: 0.2 minutes.   CONTRAST USED: Isovue 5 mL.   INDICATIONS: Ms. Burry is a 29 year old woman who required a port to be placed for hyperemesis gravidarum. She has since delivered and done well, and the baby is fine and healthy; however, following this delivery she has undergone extensive workup with a presumed diagnosis of Crohn disease, although this pathologic process has been excluded and her bowel problems have not been clearly diagnosed and she is undergoing continue a continued workup. She has very poor IV access and has required an Infuse-a-Port. Unfortunately, the last several times they attempted to aspirate and flush and draw labs, they have been unable. I am asked to evaluate. Risks and benefits were reviewed. The patient agrees to proceed.   DESCRIPTION OF PROCEDURE: The patient is taken to special procedures and placed in the supine position and her right chest wall is prepped and draped in sterile fashion. Using a 1 inch Huber needle, her port is accessed with a little bit of flushing and then aspiration. I get some blood return. Under fluoroscopy, the port is in appropriate orientation, has a smooth contour, and the tip is at the atriocaval junction.   There do not appear to be any mechanical defects.    Upon injection of contrast, the port remains at the atriocaval junction with filling of the atrium. There does appear to be a thrombus or fibrin at sheath tip, which was highlighted with contrast. Given this, 2 mg of t-PA is reconstituted in 50 mL, and is infused through the port over a 2-1/2 hour period. At the conclusion, the port aspirates easily and flushes well and is packed with heparinized saline. The patient tolerated the procedure well and arrangements to have her port flushed at reasonable intervals will be made.   ____________________________ Katha Cabal, MD ggs:bm D: 02/01/2015 18:52:48 ET T: 02/02/2015 06:17:23 ET JOB#: [id number]  cc: Katha Cabal, MD, <Dictator> Katha Cabal MD ELECTRONICALLY SIGNED 02/08/2015 15:13

## 2015-03-01 ENCOUNTER — Encounter: Payer: Self-pay | Admitting: Emergency Medicine

## 2015-03-01 ENCOUNTER — Emergency Department: Payer: Medicaid Other

## 2015-03-01 ENCOUNTER — Emergency Department
Admission: EM | Admit: 2015-03-01 | Discharge: 2015-03-01 | Disposition: A | Discharge status: Home / Self Care | Payer: Medicaid Other | Attending: Emergency Medicine | Admitting: Emergency Medicine

## 2015-03-01 DIAGNOSIS — Y9289 Other specified places as the place of occurrence of the external cause: Secondary | ICD-10-CM | POA: Diagnosis not present

## 2015-03-01 DIAGNOSIS — Y998 Other external cause status: Secondary | ICD-10-CM | POA: Diagnosis not present

## 2015-03-01 DIAGNOSIS — S0990XA Unspecified injury of head, initial encounter: Secondary | ICD-10-CM | POA: Diagnosis present

## 2015-03-01 DIAGNOSIS — R202 Paresthesia of skin: Secondary | ICD-10-CM

## 2015-03-01 DIAGNOSIS — Z87891 Personal history of nicotine dependence: Secondary | ICD-10-CM | POA: Insufficient documentation

## 2015-03-01 DIAGNOSIS — Y9389 Activity, other specified: Secondary | ICD-10-CM | POA: Insufficient documentation

## 2015-03-01 DIAGNOSIS — S060X1A Concussion with loss of consciousness of 30 minutes or less, initial encounter: Secondary | ICD-10-CM | POA: Insufficient documentation

## 2015-03-01 DIAGNOSIS — W172XXA Fall into hole, initial encounter: Secondary | ICD-10-CM | POA: Diagnosis not present

## 2015-03-01 HISTORY — DX: Depression, unspecified: F32.A

## 2015-03-01 HISTORY — DX: Bipolar disorder, unspecified: F31.9

## 2015-03-01 HISTORY — DX: Anxiety disorder, unspecified: F41.9

## 2015-03-01 HISTORY — DX: Irritable bowel syndrome, unspecified: K58.9

## 2015-03-01 HISTORY — DX: Ulcerative (chronic) pancolitis without complications: K51.00

## 2015-03-01 HISTORY — DX: Dizziness and giddiness: R42

## 2015-03-01 HISTORY — DX: Major depressive disorder, single episode, unspecified: F32.9

## 2015-03-01 HISTORY — DX: Gastric ulcer, unspecified as acute or chronic, without hemorrhage or perforation: K25.9

## 2015-03-01 HISTORY — DX: Personal history of traumatic brain injury: Z87.820

## 2015-03-01 HISTORY — DX: Crohn's disease of large intestine without complications: K50.10

## 2015-03-01 LAB — CBC WITH DIFFERENTIAL/PLATELET
Basophils Absolute: 0 10*3/uL (ref 0–0.1)
Eosinophils Absolute: 0.2 10*3/uL (ref 0–0.7)
Eosinophils Relative: 3 %
HEMATOCRIT: 41.6 % (ref 35.0–47.0)
Hemoglobin: 13.3 g/dL (ref 12.0–16.0)
Lymphs Abs: 2.3 10*3/uL (ref 1.0–3.6)
MCH: 29.1 pg (ref 26.0–34.0)
MCHC: 32 g/dL (ref 32.0–36.0)
MCV: 91.1 fL (ref 80.0–100.0)
MONO ABS: 0.8 10*3/uL (ref 0.2–0.9)
Monocytes Relative: 13 %
Neutro Abs: 3.2 10*3/uL (ref 1.4–6.5)
Neutrophils Relative %: 48 %
Platelets: 196 10*3/uL (ref 150–440)
RBC: 4.57 MIL/uL (ref 3.80–5.20)
RDW: 14.6 % — ABNORMAL HIGH (ref 11.5–14.5)
WBC: 6.6 10*3/uL (ref 3.6–11.0)

## 2015-03-01 LAB — COMPREHENSIVE METABOLIC PANEL
ALBUMIN: 4.1 g/dL (ref 3.5–5.0)
ALT: 41 U/L (ref 14–54)
ANION GAP: 5 (ref 5–15)
AST: 26 U/L (ref 15–41)
Alkaline Phosphatase: 79 U/L (ref 38–126)
BILIRUBIN TOTAL: 0.3 mg/dL (ref 0.3–1.2)
BUN: 10 mg/dL (ref 6–20)
CHLORIDE: 108 mmol/L (ref 101–111)
CO2: 28 mmol/L (ref 22–32)
Calcium: 9 mg/dL (ref 8.9–10.3)
Creatinine, Ser: 0.55 mg/dL (ref 0.44–1.00)
GFR calc non Af Amer: >60 mL/min (ref >60)
GLUCOSE: 104 mg/dL — AB (ref 65–99)
Potassium: 4.3 mmol/L (ref 3.5–5.1)
Sodium: 141 mmol/L (ref 135–145)
Total Protein: 6.9 g/dL (ref 6.5–8.1)

## 2015-03-01 LAB — BETA STREP CULTURE(ARMC)

## 2015-03-01 NOTE — ED Notes (Signed)
Pt fell in a hole backwards and hit head, unknown LOC, went to dr and was ordered CT by psychiatrist but unable d/t insurance issues, went to primary doctor this morning and sent here for CT scan, pt has had numbness in hands since fall and changes in sleeping patterns, more sleepy than usual

## 2015-03-01 NOTE — ED Provider Notes (Signed)
[facility] Provider Note   Time seen: 3:56 PM  I have reviewed the triage vital signs and the nursing notes.   HISTORY  Chief Complaint Fall and Head Injury    HPI [name] is a 29 y.o. female since the ER after falling in a hole backwards and hitting her head a week ago. She states she had some loss of consciousness which were doctor and a CT scan was ordered but due to insurance issues this was unable to be obtained. The primary care doctor sent her to the ER for evaluation. She states she has numbness in her hands since the fall changes in her sleeping patterns she's more sleepy than normal.Patient was mainly concerned because she had her port flushed with TPA and then fell subsequently soon afterward she was concerned about subarachnoid hemorrhage    Past Medical History  Diagnosis Date  . Crohn's colitis   . Ulcerative (chronic) enterocolitis   . Irritable bowel   . Multiple gastric ulcers   . History of multiple concussions   . Vertigo   . Depression   . Anxiety   . Bipolar 1 disorder     There are no active problems to display for this patient.   Past Surgical History  Procedure Laterality Date  . Rhinoplasty      No current outpatient prescriptions on file.  Allergies Demerol; Imitrex; Morphine and related; and Seroquel  No family history on file.  Social History History  Substance Use Topics  . Smoking status: Former Research scientist (life sciences)  . Smokeless tobacco: Not on file  . Alcohol Use: No    Review of Systems Constitutional: Negative for fever. Eyes: Negative for visual changes. ENT: Negative for sore throat. Cardiovascular: Negative for chest pain. Respiratory: Negative for shortness of breath. Gastrointestinal: Negative for abdominal pain, vomiting and diarrhea. Genitourinary: Negative for dysuria. Musculoskeletal: Negative for back pain. Skin: Negative for rash. Neurological: Negative for headaches,  focal weakness or numbness.  10-point ROS otherwise negative.  ____________________________________________   PHYSICAL EXAM:  VITAL SIGNS: ED Triage Vitals  Enc Vitals Group     BP 03/01/15 1328 130/93 mmHg     Pulse Rate 03/01/15 1328 78     Resp 03/01/15 1328 16     Temp 03/01/15 1328 98 F (36.7 C)     Temp src --      SpO2 03/01/15 1328 100 %     Weight 03/01/15 1328 137 lb (62.143 kg)     Height 03/01/15 1328 5\' 2"  (1.575 m)     Head Cir --      Peak Flow --      Pain Score --      Pain Loc --      Pain Edu? --      Excl. in [place]? --     Constitutional: Alert and oriented. Well appearing and in no distress. Eyes: Conjunctivae are normal. PERRL. Normal extraocular movements. ENT   Head: Normocephalic and atraumatic.   Nose: No congestion/rhinnorhea.   Mouth/Throat: Mucous membranes are moist.   Neck: No stridor. Hematological/Lymphatic/Immunilogical: No cervical lymphadenopathy. Cardiovascular: Normal rate, regular rhythm. Normal and symmetric distal pulses are present in all extremities. No murmurs, rubs, or gallops. Respiratory: Normal respiratory effort without tachypnea nor retractions. Breath sounds are clear and equal bilaterally. No wheezes/rales/rhonchi. Gastrointestinal: Soft and nontender. No distention. No abdominal bruits. There is no CVA tenderness.  Musculoskeletal: Nontender with normal range of motion in all extremities. No joint  effusions.  No lower extremity tenderness nor edema. Neurologic:  Normal speech and language. Altered light touch sensation to both hands and feet involving multiple nerve pathways. Skin:  Skin is warm, dry and intact. No rash noted. Psychiatric: Mood and affect are normal. Speech and behavior are normal. Patient exhibits appropriate insight and judgment.  ____________________________________________    LABS (pertinent positives/negatives)  Normal labs  ____________________________________________        RADIOLOGY  Normal CT head  ____________________________________________    IMPRESSION / ASSESSMENT AND PLAN / ED COURSE  Pertinent labs & imaging results that were available during my care of the patient were reviewed by me and considered in my medical decision making (see chart for details).  Impression: Concussion and paresthesias  Patient looks well her labs are normal her CT is normal. She will follow-up with her primary care doctor for reevaluation. Symptoms are likely multifactorial.   Earleen Newport, MD   Earleen Newport, MD 03/01/15 [phone]

## 2015-03-01 NOTE — Discharge Instructions (Signed)
Concussion A concussion, or closed-head injury, is a brain injury caused by a direct blow to the head or by a quick and sudden movement (jolt) of the head or neck. Concussions are usually not life-threatening. Even so, the effects of a concussion can be serious. If you have had a concussion before, you are more likely to experience concussion-like symptoms after a direct blow to the head.  CAUSES  Direct blow to the head, such as from running into another player during a soccer game, being hit in a fight, or hitting your head on a hard surface.  A jolt of the head or neck that causes the brain to move back and forth inside the skull, such as in a car crash. SIGNS AND SYMPTOMS The signs of a concussion can be hard to notice. Early on, they may be missed by you, family members, and health care providers. You may look fine but act or feel differently. Symptoms are usually temporary, but they may last for days, weeks, or even longer. Some symptoms may appear right away while others may not show up for hours or days. Every head injury is different. Symptoms include:  Mild to moderate headaches that will not go away.  A feeling of pressure inside your head.  Having more trouble than usual:  Learning or remembering things you have heard.  Answering questions.  Paying attention or concentrating.  Organizing daily tasks.  Making decisions and solving problems.  Slowness in thinking, acting or reacting, speaking, or reading.  Getting lost or being easily confused.  Feeling tired all the time or lacking energy (fatigued).  Feeling drowsy.  Sleep disturbances.  Sleeping more than usual.  Sleeping less than usual.  Trouble falling asleep.  Trouble sleeping (insomnia).  Loss of balance or feeling lightheaded or dizzy.  Nausea or vomiting.  Numbness or tingling.  Increased sensitivity to:  Sounds.  Lights.  Distractions.  Vision problems or eyes that tire  easily.  Diminished sense of taste or smell.  Ringing in the ears.  Mood changes such as feeling sad or anxious.  Becoming easily irritated or angry for little or no reason.  Lack of motivation.  Seeing or hearing things other people do not see or hear (hallucinations). DIAGNOSIS Your health care provider can usually diagnose a concussion based on a description of your injury and symptoms. He or she will ask whether you passed out (lost consciousness) and whether you are having trouble remembering events that happened right before and during your injury. Your evaluation might include:  A brain scan to look for signs of injury to the brain. Even if the test shows no injury, you may still have a concussion.  Blood tests to be sure other problems are not present. TREATMENT  Concussions are usually treated in an emergency department, in urgent care, or at a clinic. You may need to stay in the hospital overnight for further treatment.  Tell your health care provider if you are taking any medicines, including prescription medicines, over-the-counter medicines, and natural remedies. Some medicines, such as blood thinners (anticoagulants) and aspirin, may increase the chance of complications. Also tell your health care provider whether you have had alcohol or are taking illegal drugs. This information may affect treatment.  Your health care provider will send you home with important instructions to follow.  How fast you will recover from a concussion depends on many factors. These factors include how severe your concussion is, what part of your brain was injured, your  age, and how healthy you were before the concussion.  Most people with mild injuries recover fully. Recovery can take time. In general, recovery is slower in older persons. Also, persons who have had a concussion in the past or have other medical problems may find that it takes longer to recover from their current injury. HOME  CARE INSTRUCTIONS General Instructions  Carefully follow the directions your health care provider gave you.  Only take over-the-counter or prescription medicines for pain, discomfort, or fever as directed by your health care provider.  Take only those medicines that your health care provider has approved.  Do not drink alcohol until your health care provider says you are well enough to do so. Alcohol and certain other drugs may slow your recovery and can put you at risk of further injury.  If it is harder than usual to remember things, write them down.  If you are easily distracted, try to do one thing at a time. For example, do not try to watch TV while fixing dinner.  Talk with family members or close friends when making important decisions.  Keep all follow-up appointments. Repeated evaluation of your symptoms is recommended for your recovery.  Watch your symptoms and tell others to do the same. Complications sometimes occur after a concussion. Older adults with a brain injury may have a higher risk of serious complications, such as a blood clot on the brain.  Tell your teachers, school nurse, school counselor, coach, athletic trainer, or work Freight forwarder about your injury, symptoms, and restrictions. Tell them about what you can or cannot do. They should watch for:  Increased problems with attention or concentration.  Increased difficulty remembering or learning new information.  Increased time needed to complete tasks or assignments.  Increased irritability or decreased ability to cope with stress.  Increased symptoms.  Rest. Rest helps the brain to heal. Make sure you:  Get plenty of sleep at night. Avoid staying up late at night.  Keep the same bedtime hours on weekends and weekdays.  Rest during the day. Take daytime naps or rest breaks when you feel tired.  Limit activities that require a lot of thought or concentration. These include:  Doing homework or job-related  work.  Watching TV.  Working on the computer.  Avoid any situation where there is potential for another head injury (football, hockey, soccer, basketball, martial arts, downhill snow sports and horseback riding). Your condition will get worse every time you experience a concussion. You should avoid these activities until you are evaluated by the appropriate follow-up health care providers. Returning To Your Regular Activities You will need to return to your normal activities slowly, not all at once. You must give your body and brain enough time for recovery.  Do not return to sports or other athletic activities until your health care provider tells you it is safe to do so.  Ask your health care provider when you can drive, ride a bicycle, or operate heavy machinery. Your ability to react may be slower after a brain injury. Never do these activities if you are dizzy.  Ask your health care provider about when you can return to work or school. Preventing Another Concussion It is very important to avoid another brain injury, especially before you have recovered. In rare cases, another injury can lead to permanent brain damage, brain swelling, or death. The risk of this is greatest during the first 7-10 days after a head injury. Avoid injuries by:  Wearing a seat  belt when riding in a car.  Drinking alcohol only in moderation.  Wearing a helmet when biking, skiing, skateboarding, skating, or doing similar activities.  Avoiding activities that could lead to a second concussion, such as contact or recreational sports, until your health care provider says it is okay.  Taking safety measures in your home.  Remove clutter and tripping hazards from floors and stairways.  Use grab bars in bathrooms and handrails by stairs.  Place non-slip mats on floors and in bathtubs.  Improve lighting in dim areas. SEEK MEDICAL CARE IF:  You have increased problems paying attention or  concentrating.  You have increased difficulty remembering or learning new information.  You need more time to complete tasks or assignments than before.  You have increased irritability or decreased ability to cope with stress.  You have more symptoms than before. Seek medical care if you have any of the following symptoms for more than 2 weeks after your injury:  Lasting (chronic) headaches.  Dizziness or balance problems.  Nausea.  Vision problems.  Increased sensitivity to noise or light.  Depression or mood swings.  Anxiety or irritability.  Memory problems.  Difficulty concentrating or paying attention.  Sleep problems.  Feeling tired all the time. SEEK IMMEDIATE MEDICAL CARE IF:  You have severe or worsening headaches. These may be a sign of a blood clot in the brain.  You have weakness (even if only in one hand, leg, or part of the face).  You have numbness.  You have decreased coordination.  You vomit repeatedly.  You have increased sleepiness.  One pupil is larger than the other.  You have convulsions.  You have slurred speech.  You have increased confusion. This may be a sign of a blood clot in the brain.  You have increased restlessness, agitation, or irritability.  You are unable to recognize people or places.  You have neck pain.  It is difficult to wake you up.  You have unusual behavior changes.  You lose consciousness. MAKE SURE YOU:  Understand these instructions.  Will watch your condition.  Will get help right away if you are not doing well or get worse. Document Released: 01/05/2004 Document Revised: 10/20/2013 Document Reviewed: 05/07/2013 [facility] Patient Information 2015 [place], [place]. This information is not intended to replace advice given to you by your health care provider. Make sure you discuss any questions you have with your health care provider.  Paresthesia Paresthesia is a burning or prickling feeling. This  feeling can happen in any part of the body. It often happens in the hands, arms, legs, or feet. HOME CARE  Avoid drinking alcohol.  Try massage or needle therapy (acupuncture) to help with your problems.  Keep all doctor visits as told. GET HELP RIGHT AWAY IF:   You feel weak.  You have trouble walking or moving.  You have problems speaking or seeing.  You feel confused.  You cannot control when you poop (bowel movement) or pee (urinate).  You lose feeling (numbness) after an injury.  You pass out (faint).  Your burning or prickling feeling gets worse when you walk.  You have pain, cramps, or feel dizzy.  You have a rash. MAKE SURE YOU:   Understand these instructions.  Will watch your condition.  Will get help right away if you are not doing well or get worse. Document Released: 09/27/2008 Document Revised: 01/07/2012 Document Reviewed: 07/06/2011 [facility] Patient Information 2015 [place], [place]. This information is not intended to replace advice  given to you by your health care provider. Make sure you discuss any questions you have with your health care provider.

## 2015-03-01 NOTE — ED Notes (Signed)
Reports service dog died, then slept for 4 days.  Was being evaluated by psychiatrist for psychosis but also has a port and had a blood clot in port which they gave her tPa for and then tripped in a hole and fell and hit head.  Unsure of LOC.  Reports been more sleepy than usual.  And reporting numbness and tingling to bilateral hands and feet. NAD. Alert and oriented.

## 2015-03-08 NOTE — H&P (Signed)
L&D Evaluation:  History:  HPI 30 yowf [id number], last menstual period 12/07/13, estimated date of confinement 09/13/14, EGA 22.6 weeks admitted with recurrent N/V and upper abdominal pain. Had 5 episodes of emesis earlier today, non bloody. BM dark without BRB or mucous. Pt on Prevacid 30 mg daily, stool softener, Prenatal vitamin, and Dilaudid prn pain. Took dilaudid x2 in past 24 hours. No fever, anorexia, chills/sweats. No UTI symptoms.  Past history of Crohn's disease. Was previously managed by Dr Vira Agar, and last admission in May 2015 was evaluated by Dr. Gustavo Lah. Work up last admission revealed no evidence of GB Disease on U/S, and MRI which did not show any evidence of small bowel disease or appendicitis.   Presents with abdominal pain, nausea/vomiting   Patient's Medical History Crohn's Disease; Anxiety/Depression; Gastritis; Migraine; Seasonal Allergies.   Patient's Surgical History none   Medications Pre [place] Vitamins  Prevacid, Provida OB, Stool Softener, Dilaudid   Allergies Morphine, Demerol   Social History none   Family History Non-Contributory   ROS:  ROS No UTI sxs; No PIH symptoms   Exam:  Vital Signs stable   Urine Protein negative dipstick   General Uncomfortable   Mental Status clear   Heart normal sinus rhythm   Abdomen gravid, non-tender, Mild Tympany; No guarding/peritoneal signs   Estimated Fetal Weight Average for gestational age   Back no CVAT   Edema no edema   Mebranes Intact   FHT normal rate with no decels   Ucx absent   Skin dry   Lymph no lymphadenopathy   Other CBC: mild thrombocytopenia, Normal WBC.   Impression:  Impression 23 week Intrauterine pregnancy; Suspected Crohn's flare   Plan:  Plan IV access; Dilaudid IV 0.5 mg   Comments GI Consultation today (case discussed with Dr. Gustavo Lah)   Electronic Signatures: Aisea Bouldin, Alanda Slim (MD)  (Signed 19-Jul-15 16:22)  Authored: L&D Evaluation   Last Updated:  19-Jul-15 16:22 by Keala Drum, Alanda Slim (MD)

## 2015-04-01 ENCOUNTER — Encounter: Payer: Self-pay | Admitting: Obstetrics and Gynecology

## 2015-04-05 NOTE — Progress Notes (Signed)
Quick Note:  Please let her know her pap was negative ______

## 2015-04-06 NOTE — Progress Notes (Signed)
Notified pt of results 

## 2015-04-07 ENCOUNTER — Inpatient Hospital Stay: Payer: Medicaid Other | Attending: Family Medicine

## 2015-04-07 ENCOUNTER — Inpatient Hospital Stay: Payer: Medicaid Other

## 2015-04-07 DIAGNOSIS — Z452 Encounter for adjustment and management of vascular access device: Secondary | ICD-10-CM | POA: Insufficient documentation

## 2015-04-07 DIAGNOSIS — K5090 Crohn's disease, unspecified, without complications: Secondary | ICD-10-CM | POA: Diagnosis present

## 2015-04-07 DIAGNOSIS — Z95828 Presence of other vascular implants and grafts: Secondary | ICD-10-CM

## 2015-04-07 MED ORDER — HEPARIN SOD (PORK) LOCK FLUSH 100 UNIT/ML IV SOLN
500.0000 [IU] | Freq: Once | INTRAVENOUS | Status: AC
  Administered 2015-04-07: 500 [IU] via INTRAVENOUS
  Filled 2015-04-07: qty 5

## 2015-04-07 MED ORDER — SODIUM CHLORIDE 0.9 % IJ SOLN
10.0000 mL | INTRAMUSCULAR | Status: DC | PRN
  Administered 2015-04-07: 10 mL via INTRAVENOUS
  Filled 2015-04-07: qty 10

## 2015-04-13 ENCOUNTER — Telehealth: Payer: Self-pay | Admitting: Family Medicine

## 2015-04-13 NOTE — Telephone Encounter (Signed)
Left details on VM for [facility]. [facility]

## 2015-04-13 NOTE — Telephone Encounter (Signed)
Npi numbers for patient.

## 2015-04-15 ENCOUNTER — Encounter: Payer: Self-pay | Admitting: Family Medicine

## 2015-04-15 ENCOUNTER — Ambulatory Visit (INDEPENDENT_AMBULATORY_CARE_PROVIDER_SITE_OTHER): Payer: Medicaid Other | Admitting: Family Medicine

## 2015-04-15 VITALS — BP 127/85 | HR 79 | Temp 98.0°F | Resp 16 | Ht 62.0 in | Wt 143.0 lb

## 2015-04-15 DIAGNOSIS — J029 Acute pharyngitis, unspecified: Secondary | ICD-10-CM | POA: Diagnosis not present

## 2015-04-15 DIAGNOSIS — J019 Acute sinusitis, unspecified: Secondary | ICD-10-CM | POA: Diagnosis not present

## 2015-04-15 LAB — POCT RAPID STREP A (OFFICE): Rapid Strep A Screen: NEGATIVE

## 2015-04-15 MED ORDER — AMOXICILLIN-POT CLAVULANATE 875-125 MG PO TABS: 1.0000 | ORAL_TABLET | Freq: Two times a day (BID) | ORAL | Status: AC

## 2015-04-15 NOTE — Progress Notes (Signed)
Subjective:    Patient ID: [name], female    DOB: 1986-06-30, 29 y.o.   MRN: [id number]  HPI: [name] is a 29 y.o. female presenting on 04/15/2015 for Sore Throat and Sinusitis   Sore Throat  This is a recurrent problem. The current episode started in the past 7 days. The problem has been gradually worsening. There has been no fever. The pain is at a severity of 2/10. The pain is mild. Associated symptoms include congestion, headaches, a hoarse voice and swollen glands. Pertinent negatives include no coughing, diarrhea, drooling, ear discharge, plugged ear sensation, shortness of breath or vomiting. She has had no exposure to strep (unknown.). She has tried gargles for the symptoms.  Sinusitis This is a chronic problem. The current episode started more than 1 year ago. The problem has been gradually worsening since onset. There has been no fever. Her pain is at a severity of 2/10. Associated symptoms include congestion, headaches, a hoarse voice, sinus pressure, a sore throat and swollen glands. Pertinent negatives include no chills, coughing or shortness of breath. Past treatments include saline sprays.     Past Medical History  Diagnosis Date  . Crohn's colitis   . Ulcerative (chronic) enterocolitis   . Irritable bowel   . Multiple gastric ulcers   . History of multiple concussions   . Vertigo   . Depression   . Anxiety   . Bipolar 1 disorder     No current outpatient prescriptions on file prior to visit.   No current facility-administered medications on file prior to visit.    Review of Systems  Constitutional: Negative for fever and chills.  HENT: Positive for congestion, hoarse voice, sinus pressure and sore throat. Negative for drooling and ear discharge.   Respiratory: Negative for cough and shortness of breath.   Gastrointestinal: Negative for vomiting and diarrhea.  Neurological: Positive for headaches.  Psychiatric/Behavioral: Negative.    Per HPI  unless specifically indicated above     Objective:    BP 127/85 mmHg  Pulse 79  Temp(Src) 98 F (36.7 C) (Oral)  Resp 16  Ht 5\' 2"  (1.575 m)  Wt 143 lb (64.864 kg)  BMI 26.15 kg/m2  LMP  (LMP Unknown)  Wt Readings from Last 3 Encounters:  04/15/15 143 lb (64.864 kg)  03/01/15 137 lb (62.143 kg)    Physical Exam  Constitutional: She appears well-developed and well-nourished. No distress.  HENT:  Head: Normocephalic.  Right Ear: Hearing normal. Tympanic membrane is retracted. Tympanic membrane is not erythematous and not bulging.  Left Ear: Hearing normal. Tympanic membrane is retracted. Tympanic membrane is not erythematous and not bulging.  Nose: Mucosal edema and rhinorrhea present. No sinus tenderness or nasal septal hematoma. Right sinus exhibits maxillary sinus tenderness and frontal sinus tenderness. Left sinus exhibits maxillary sinus tenderness and frontal sinus tenderness.  Mouth/Throat: Uvula is midline and mucous membranes are normal. No uvula swelling. Oropharyngeal exudate, posterior oropharyngeal edema and posterior oropharyngeal erythema present.  Neck: Neck supple. No Brudzinski's sign and no Kernig's sign noted.  Cardiovascular: Normal rate, regular rhythm and normal heart sounds.   Pulmonary/Chest: Effort normal and breath sounds normal. No accessory muscle usage. No tachypnea. No respiratory distress.  Lymphadenopathy:    She has no cervical adenopathy.   Results for orders placed or performed in visit on 04/15/15  POCT rapid strep A  Result Value Ref Range   Rapid Strep A Screen Negative Negative      Assessment &  Plan:   Problem List Items Addressed This Visit    None    Visit Diagnoses    Sore throat    -  Primary    Rapid strep is negative. Will culture. Abx coverage to cover for strep pending culture.    Relevant Orders    POCT rapid strep A (Completed)    Culture, Group A Strep    Acute sinusitis treated with antibiotics in the past 60 days         Abx given duration of symptoms, pt facial tenderness, and upcoming surgery. Pt will clear with her [facility].     Relevant Medications    amoxicillin-clavulanate (AUGMENTIN) 875-125 MG per tablet       Meds ordered this encounter  Medications  . clonazePAM (KLONOPIN) 0.5 MG tablet    Sig: Take 0.5 mg by mouth 2 (two) times daily as needed for anxiety.  Marland Kitchen amoxicillin-clavulanate (AUGMENTIN) 875-125 MG per tablet    Sig: Take 1 tablet by mouth 2 (two) times daily.    Dispense:  20 tablet    Refill:  0    Order Specific Question:  Supervising Provider    Answer:  Arlis Porta [[id number]]      Follow up plan: Return if symptoms worsen or fail to improve.

## 2015-04-15 NOTE — Patient Instructions (Signed)
Treat for sinusitis considering upcoming surgery and duration of symptoms Please call Dr. Anell Barr' office to see if you need to stop antibiotic in a certain amount of time prior to surgery. Throat culture sent:  Salt gargles four times daily. Hot tea or hot water to soothe. Losenges as needed. Chlorseptic spray. Continue flonase and saline rinses OTC.

## 2015-04-17 LAB — CULTURE, GROUP A STREP: STREP A CULTURE: NEGATIVE

## 2015-05-05 ENCOUNTER — Encounter: Payer: Self-pay | Admitting: Urgent Care

## 2015-05-05 ENCOUNTER — Emergency Department
Admission: EM | Admit: 2015-05-05 | Discharge: 2015-05-05 | Disposition: A | Discharge status: Other Acute Inpatient Hospital | Payer: Medicaid Other | Attending: Emergency Medicine | Admitting: Emergency Medicine

## 2015-05-05 DIAGNOSIS — F419 Anxiety disorder, unspecified: Secondary | ICD-10-CM | POA: Insufficient documentation

## 2015-05-05 DIAGNOSIS — Z79899 Other long term (current) drug therapy: Secondary | ICD-10-CM | POA: Insufficient documentation

## 2015-05-05 DIAGNOSIS — J95830 Postprocedural hemorrhage and hematoma of a respiratory system organ or structure following a respiratory system procedure: Secondary | ICD-10-CM | POA: Diagnosis present

## 2015-05-05 DIAGNOSIS — Y836 Removal of other organ (partial) (total) as the cause of abnormal reaction of the patient, or of later complication, without mention of misadventure at the time of the procedure: Secondary | ICD-10-CM | POA: Insufficient documentation

## 2015-05-05 DIAGNOSIS — Z87891 Personal history of nicotine dependence: Secondary | ICD-10-CM | POA: Insufficient documentation

## 2015-05-05 DIAGNOSIS — T888XXA Other specified complications of surgical and medical care, not elsewhere classified, initial encounter: Secondary | ICD-10-CM

## 2015-05-05 LAB — CBC
HEMATOCRIT: 40.4 % (ref 35.0–47.0)
Hemoglobin: 13 g/dL (ref 12.0–16.0)
MCH: 28.9 pg (ref 26.0–34.0)
MCHC: 32.2 g/dL (ref 32.0–36.0)
MCV: 89.8 fL (ref 80.0–100.0)
Platelets: 199 10*3/uL (ref 150–440)
RBC: 4.51 MIL/uL (ref 3.80–5.20)
RDW: 13.8 % (ref 11.5–14.5)
WBC: 8 10*3/uL (ref 3.6–11.0)

## 2015-05-05 LAB — BASIC METABOLIC PANEL
Anion gap: 6 (ref 5–15)
BUN: 12 mg/dL (ref 6–20)
CALCIUM: 8.1 mg/dL — AB (ref 8.9–10.3)
CO2: 25 mmol/L (ref 22–32)
Chloride: 108 mmol/L (ref 101–111)
Creatinine, Ser: 0.56 mg/dL (ref 0.44–1.00)
GLUCOSE: 97 mg/dL (ref 65–99)
POTASSIUM: 3.4 mmol/L — AB (ref 3.5–5.1)
Sodium: 139 mmol/L (ref 135–145)

## 2015-05-05 MED ORDER — ONDANSETRON HCL 4 MG/2ML IJ SOLN
4.0000 mg | Freq: Once | INTRAMUSCULAR | Status: AC
  Administered 2015-05-05: 4 mg via INTRAVENOUS

## 2015-05-05 MED ORDER — HYDROMORPHONE HCL 1 MG/ML IJ SOLN
1.0000 mg | Freq: Once | INTRAMUSCULAR | Status: AC
  Administered 2015-05-05: 1 mg via INTRAVENOUS

## 2015-05-05 MED ORDER — ONDANSETRON HCL 4 MG/2ML IJ SOLN
INTRAMUSCULAR | Status: AC
  Administered 2015-05-05: 4 mg via INTRAVENOUS
  Filled 2015-05-05: qty 2

## 2015-05-05 MED ORDER — DEXAMETHASONE SODIUM PHOSPHATE 10 MG/ML IJ SOLN
INTRAMUSCULAR | Status: AC
  Administered 2015-05-05: 10 mg via INTRAVENOUS
  Filled 2015-05-05: qty 1

## 2015-05-05 MED ORDER — HYDROMORPHONE HCL 1 MG/ML IJ SOLN
INTRAMUSCULAR | Status: AC
  Administered 2015-05-05: 1 mg via INTRAVENOUS
  Filled 2015-05-05: qty 1

## 2015-05-05 MED ORDER — DEXAMETHASONE SODIUM PHOSPHATE 10 MG/ML IJ SOLN
10.0000 mg | Freq: Once | INTRAMUSCULAR | Status: AC
  Administered 2015-05-05: 10 mg via INTRAVENOUS

## 2015-05-05 NOTE — ED Notes (Signed)
Patient presents with reports of bleeding and pain from tonsillectomy last Thursday. Bleeding only started 39mins ago.

## 2015-05-05 NOTE — ED Provider Notes (Signed)
[facility] Emergency Department Provider Note  ____________________________________________  Time seen: Approximately 0029 AM  I have reviewed the triage vital signs and the nursing notes.   HISTORY  Chief Complaint Post-op Problem    HPI [name] is a 29 y.o. female who had a tonsillectomy one week ago at [facility]. According to the patient's father she has had some pain but started bleeding approximately 1 hour prior to arrival. The patient reports that she's feeling as though the blood is going down her throat and is very nauseous. According to the father the patient did take a Phenergan suppository for nausea but it has not helped. The patient has been taking Vicodin liquid for pain but it has not been helping. The patient was told she should start taking ibuprofen as well. The patient is unable to stand the pain and it is a 10 out of 10 in intensity. She also feels as though the back of her throat is swelling and she feels that she can't breathe. Dad was very concerned given the patient's difficulty breathing so he decided to bring her here for further evaluation. The patient is also spitting with some blood mixed with saliva.   Past Medical History  Diagnosis Date  . Crohn's colitis   . Ulcerative (chronic) enterocolitis   . Irritable bowel   . Multiple gastric ulcers   . History of multiple concussions   . Vertigo   . Depression   . Anxiety   . Bipolar 1 disorder     Patient Active Problem List   Diagnosis Date Noted  . Anxiety and depression 08/30/2014  . Clinical trial participant 08/30/2014  . Not immune to rubella 06/29/2014  . Poor venous access 06/12/2014  . Bowel disease, inflammatory 06/12/2014  . Suicidal ideation 06/12/2014  . Benign gestational thrombocytopenia 06/12/2014  . High-risk pregnancy 06/09/2014    Past Surgical History  Procedure Laterality Date  . Rhinoplasty      Current Outpatient Rx  Name  Route  Sig  Dispense   Refill  . clonazePAM (KLONOPIN) 0.5 MG tablet   Oral   Take 0.5 mg by mouth 2 (two) times daily as needed for anxiety.         . dicyclomine (BENTYL) 10 MG capsule   Oral   Take 10 mg by mouth.         . fluticasone (FLONASE) 50 MCG/ACT nasal spray   Nasal   Place into the nose.         . promethazine (PHENERGAN) 25 MG tablet   Oral   Take 25 mg by mouth.           Allergies Buprenorphine hcl; Morphine; Origanum oil; Demerol; Imitrex; Morphine and related; Seroquel; Sumatriptan succinate; Seroquel ; and Other  Family History  Problem Relation Age of Onset  . Thyroid disease Mother   . Cancer Father 49    prostate cancer  . Thyroid disease Sister     Social History History  Substance Use Topics  . Smoking status: Former Research scientist (life sciences)  . Smokeless tobacco: Not on file  . Alcohol Use: No    Review of Systems Constitutional: No fever/chills Eyes: No visual changes. ENT: No sore throat. Cardiovascular: Denies chest pain. Respiratory: Denies shortness of breath. Gastrointestinal: No abdominal pain.  No nausea, no vomiting.  No diarrhea.  No constipation. Genitourinary: Negative for dysuria. Musculoskeletal: Negative for back pain. Skin: Negative for rash. Neurological: Negative for headaches, focal weakness or numbness.  10-point  ROS otherwise negative.  ____________________________________________   PHYSICAL EXAM:  VITAL SIGNS: ED Triage Vitals  Enc Vitals Group     BP 05/05/15 0022 143/117 mmHg     Pulse Rate 05/05/15 0022 102     Resp 05/05/15 0022 22     Temp 05/05/15 0022 98.6 F (37 C)     Temp Source 05/05/15 0022 Oral     SpO2 05/05/15 0022 100 %     Weight 05/05/15 0022 130 lb (58.968 kg)     Height 05/05/15 0022 5\' 2"  (1.575 m)     Head Cir --      Peak Flow --      Pain Score 05/05/15 0022 10     Pain Loc --      Pain Edu? --      Excl. in [place]? --     Constitutional: Alert and oriented. Well appearing and in no acute distress. Eyes:  Conjunctivae are normal. PERRL. EOMI. Head: Atraumatic. Nose: No congestion/rhinnorhea. Mouth/Throat: Mucous membranes are moist.  Blood coming from the right tonsillar pillar Cardiovascular: Normal rate, regular rhythm. Grossly normal heart sounds.  Good peripheral circulation. Respiratory: Normal respiratory effort.  No retractions. Lungs CTAB. Gastrointestinal: Soft and nontender. No distention. Positive bowel sounds Genitourinary: deferred Musculoskeletal: No lower extremity tenderness nor edema.  No joint effusions. Neurologic:  Normal speech and language. No gross focal neurologic deficits are appreciated.  Skin:  Skin is warm, dry and intact. No rash noted. Psychiatric: Patient very anxious ____________________________________________   LABS (all labs ordered are listed, but only abnormal results are displayed)  Labs Reviewed  BASIC METABOLIC PANEL - Abnormal; Notable for the following:    Potassium 3.4 (*)    Calcium 8.1 (*)    All other components within normal limits  CBC   ____________________________________________  EKG  None ____________________________________________  RADIOLOGY  None ____________________________________________   PROCEDURES  Procedure(s) performed: None  Critical Care performed: No  ____________________________________________   INITIAL IMPRESSION / ASSESSMENT AND PLAN / ED COURSE  Pertinent labs & imaging results that were available during my care of the patient were reviewed by me and considered in my medical decision making (see chart for details).  This is a 29 year old female who had tonsillectomy one week ago comes in with bleeding from her right tonsillar pillar. Since the patient had the surgery done at another hospital I contacted [facility] to see if they would accept the patient given that she does have some continued bleeding. The patient's H&H is unremarkable and her vital signs at this time are stable. I spoke to Dr.Sreenath  who accepted the patient to the emergency department at [facility] where the patient can be evaluated to see if she needs further surgery. The patient did receive a dose of Dilaudid as well as Decadron and Zofran. The patient be transferred to [facility] for further evaluation. ____________________________________________   FINAL CLINICAL IMPRESSION(S) / ED DIAGNOSES  Final diagnoses:  Post-tonsillectomy hemorrhage, initial encounter      Loney Hering, MD 05/05/15 [phone]

## 2015-05-19 ENCOUNTER — Inpatient Hospital Stay: Payer: Medicaid Other | Attending: Family Medicine

## 2015-06-30 ENCOUNTER — Inpatient Hospital Stay: Payer: Medicaid Other | Attending: Family Medicine

## 2015-06-30 DIAGNOSIS — Z452 Encounter for adjustment and management of vascular access device: Secondary | ICD-10-CM | POA: Insufficient documentation

## 2015-06-30 DIAGNOSIS — K5090 Crohn's disease, unspecified, without complications: Secondary | ICD-10-CM | POA: Insufficient documentation

## 2015-07-29 ENCOUNTER — Inpatient Hospital Stay: Payer: Medicaid Other

## 2015-07-29 DIAGNOSIS — Z95828 Presence of other vascular implants and grafts: Secondary | ICD-10-CM

## 2015-07-29 DIAGNOSIS — K5090 Crohn's disease, unspecified, without complications: Secondary | ICD-10-CM | POA: Diagnosis present

## 2015-07-29 DIAGNOSIS — Z452 Encounter for adjustment and management of vascular access device: Secondary | ICD-10-CM | POA: Diagnosis not present

## 2015-07-29 MED ORDER — HEPARIN SOD (PORK) LOCK FLUSH 100 UNIT/ML IV SOLN
500.0000 [IU] | Freq: Once | INTRAVENOUS | Status: AC
  Administered 2015-07-29: 500 [IU] via INTRAVENOUS

## 2015-07-29 MED ORDER — SODIUM CHLORIDE 0.9 % IJ SOLN
10.0000 mL | INTRAMUSCULAR | Status: DC | PRN
  Administered 2015-07-29: 10 mL via INTRAVENOUS
  Filled 2015-07-29: qty 10

## 2015-08-11 ENCOUNTER — Inpatient Hospital Stay: Payer: Medicaid Other

## 2015-09-08 ENCOUNTER — Inpatient Hospital Stay: Payer: Medicaid Other | Attending: Family Medicine

## 2015-09-08 VITALS — BP 119/69 | HR 95 | Temp 98.1°F | Resp 18

## 2015-09-08 DIAGNOSIS — Z95828 Presence of other vascular implants and grafts: Secondary | ICD-10-CM

## 2015-09-08 DIAGNOSIS — K5090 Crohn's disease, unspecified, without complications: Secondary | ICD-10-CM | POA: Diagnosis present

## 2015-09-08 DIAGNOSIS — Z452 Encounter for adjustment and management of vascular access device: Secondary | ICD-10-CM | POA: Diagnosis not present

## 2015-09-08 MED ORDER — SODIUM CHLORIDE 0.9 % IJ SOLN
10.0000 mL | INTRAMUSCULAR | Status: DC | PRN
  Administered 2015-09-08: 10 mL via INTRAVENOUS
  Filled 2015-09-08: qty 10

## 2015-09-08 MED ORDER — HEPARIN SOD (PORK) LOCK FLUSH 100 UNIT/ML IV SOLN
INTRAVENOUS | Status: AC
  Filled 2015-09-08: qty 5

## 2015-09-08 MED ORDER — HEPARIN SOD (PORK) LOCK FLUSH 100 UNIT/ML IV SOLN
500.0000 [IU] | Freq: Once | INTRAVENOUS | Status: AC
  Administered 2015-09-08: 500 [IU] via INTRAVENOUS

## 2015-09-22 ENCOUNTER — Inpatient Hospital Stay: Payer: Medicaid Other

## 2015-10-18 ENCOUNTER — Emergency Department
Admission: EM | Admit: 2015-10-18 | Discharge: 2015-10-18 | Disposition: A | Discharge status: Home / Self Care | Payer: Medicaid Other | Attending: Emergency Medicine | Admitting: Emergency Medicine

## 2015-10-18 ENCOUNTER — Encounter: Payer: Self-pay | Admitting: Emergency Medicine

## 2015-10-18 DIAGNOSIS — Z87891 Personal history of nicotine dependence: Secondary | ICD-10-CM | POA: Insufficient documentation

## 2015-10-18 DIAGNOSIS — Z79899 Other long term (current) drug therapy: Secondary | ICD-10-CM | POA: Insufficient documentation

## 2015-10-18 DIAGNOSIS — G43801 Other migraine, not intractable, with status migrainosus: Secondary | ICD-10-CM

## 2015-10-18 DIAGNOSIS — R51 Headache: Secondary | ICD-10-CM | POA: Diagnosis present

## 2015-10-18 HISTORY — DX: Migraine, unspecified, not intractable, without status migrainosus: G43.909

## 2015-10-18 MED ORDER — PROCHLORPERAZINE EDISYLATE 5 MG/ML IJ SOLN
INTRAMUSCULAR | Status: AC
  Administered 2015-10-18: 10 mg via INTRAMUSCULAR
  Filled 2015-10-18: qty 2

## 2015-10-18 MED ORDER — DIPHENHYDRAMINE HCL 25 MG PO CAPS
50.0000 mg | ORAL_CAPSULE | Freq: Once | ORAL | Status: AC
  Administered 2015-10-18: 50 mg via ORAL

## 2015-10-18 MED ORDER — DEXAMETHASONE SODIUM PHOSPHATE 10 MG/ML IJ SOLN
10.0000 mg | Freq: Once | INTRAMUSCULAR | Status: AC
  Administered 2015-10-18: 10 mg via INTRAMUSCULAR

## 2015-10-18 MED ORDER — DEXAMETHASONE SODIUM PHOSPHATE 10 MG/ML IJ SOLN
INTRAMUSCULAR | Status: AC
  Administered 2015-10-18: 10 mg via INTRAMUSCULAR
  Filled 2015-10-18: qty 1

## 2015-10-18 MED ORDER — PROCHLORPERAZINE EDISYLATE 5 MG/ML IJ SOLN
10.0000 mg | Freq: Once | INTRAMUSCULAR | Status: AC
  Administered 2015-10-18: 10 mg via INTRAMUSCULAR

## 2015-10-18 MED ORDER — HYDROMORPHONE HCL 1 MG/ML IJ SOLN
1.0000 mg | Freq: Once | INTRAMUSCULAR | Status: AC
  Administered 2015-10-18: 1 mg via INTRAMUSCULAR

## 2015-10-18 MED ORDER — BUTALBITAL-APAP-CAFFEINE 50-325-40 MG PO TABS: 1.0000 | ORAL_TABLET | Freq: Four times a day (QID) | ORAL | Status: DC | PRN

## 2015-10-18 MED ORDER — DIPHENHYDRAMINE HCL 25 MG PO CAPS
ORAL_CAPSULE | ORAL | Status: AC
  Filled 2015-10-18: qty 1

## 2015-10-18 MED ORDER — HYDROMORPHONE HCL 1 MG/ML IJ SOLN
INTRAMUSCULAR | Status: AC
  Administered 2015-10-18: 1 mg via INTRAMUSCULAR
  Filled 2015-10-18: qty 1

## 2015-10-18 NOTE — Discharge Instructions (Signed)

## 2015-10-18 NOTE — ED Provider Notes (Signed)
[facility] Emergency Department Provider Note  ____________________________________________  Time seen: Approximately 240 PM  I have reviewed the triage vital signs and the nursing notes.   HISTORY  Chief Complaint Headache    HPI [name] is a 29 y.o. female with a history of Crohn's disease as well as concussion and migraine who is presenting today with a right frontal headache over the past 24 hours. She says that yesterday she had sudden onset of this headache associated with photophobia and nausea and vomiting. She says that the sudden onset is typical of these headaches and this feels similar to previous migraines. She says that she has taken 2 rounds of by mouth Dilaudid as well as Phenergan and Xanax at home without relief. She says that she is being seen currently at [facility] for her care. Her mother is in the room as well and says that she used to have similar headaches with sudden onset pain. The patient denies any fever. She says she is on her menses right now which is when she gets the headaches.Patient says that the pain radiates from the right front of her head all the way to the back of her head.   Past Medical History  Diagnosis Date  . Crohn's colitis ([place])   . Ulcerative (chronic) enterocolitis ([place])   . Irritable bowel   . Multiple gastric ulcers   . History of multiple concussions   . Vertigo   . Depression   . Anxiety   . Bipolar 1 disorder ([place])   . Migraines     Patient Active Problem List   Diagnosis Date Noted  . Anxiety and depression 08/30/2014  . Clinical trial participant 08/30/2014  . Not immune to rubella 06/29/2014  . Poor venous access 06/12/2014  . Bowel disease, inflammatory 06/12/2014  . Suicidal ideation 06/12/2014  . Benign gestational thrombocytopenia ([place]) 06/12/2014  . High-risk pregnancy 06/09/2014    Past Surgical History  Procedure Laterality Date  . Rhinoplasty      Current Outpatient Rx  Name   Route  Sig  Dispense  Refill  . clonazePAM (KLONOPIN) 0.5 MG tablet   Oral   Take 0.5 mg by mouth 2 (two) times daily as needed for anxiety.         . dicyclomine (BENTYL) 10 MG capsule   Oral   Take 10 mg by mouth.         . fluticasone (FLONASE) 50 MCG/ACT nasal spray   Nasal   Place into the nose.         . promethazine (PHENERGAN) 25 MG tablet   Oral   Take 25 mg by mouth.           Allergies Buprenorphine hcl; Morphine; Origanum oil; Demerol; Imitrex; Morphine and related; Seroquel; Sumatriptan succinate; Seroquel ; and Other  Family History  Problem Relation Age of Onset  . Thyroid disease Mother   . Cancer Father 84    prostate cancer  . Thyroid disease Sister     Social History Social History  Substance Use Topics  . Smoking status: Former Research scientist (life sciences)  . Smokeless tobacco: None  . Alcohol Use: No    Review of Systems Constitutional: No fever/chills Eyes: No visual changes. ENT: No sore throat. Cardiovascular: Denies chest pain. Respiratory: Denies shortness of breath. Gastrointestinal: No abdominal pain.    No diarrhea.  No constipation. Genitourinary: Negative for dysuria. Musculoskeletal: Negative for back pain. Skin: Negative for rash. Neurological: Negative for focal weakness  or numbness.  10-point ROS otherwise negative.  ____________________________________________   PHYSICAL EXAM:  VITAL SIGNS: ED Triage Vitals  Enc Vitals Group     BP 10/18/15 0959 135/89 mmHg     Pulse Rate 10/18/15 0959 108     Resp 10/18/15 0959 20     Temp 10/18/15 0959 98.7 F (37.1 C)     Temp Source 10/18/15 0959 Oral     SpO2 10/18/15 0959 98 %     Weight 10/18/15 0959 130 lb (58.968 kg)     Height 10/18/15 0959 5\' 5"  (1.651 m)     Head Cir --      Peak Flow --      Pain Score 10/18/15 0959 10     Pain Loc --      Pain Edu? --      Excl. in [place]? --     Constitutional: Alert and oriented. Well appearing and in no acute distress. Eyes: Conjunctivae  are normal. PERRL. EOMI. Head: Atraumatic. Nose: No congestion/rhinnorhea. Mouth/Throat: Mucous membranes are moist.   Neck: No stridor.   Cardiovascular: Normal rate, regular rhythm. Grossly normal heart sounds.  Good peripheral circulation. Respiratory: Normal respiratory effort.  No retractions. Lungs CTAB. Gastrointestinal: Soft and nontender. No distention. No abdominal bruits. No CVA tenderness. Musculoskeletal: No lower extremity tenderness nor edema.  No joint effusions. Neurologic:  Normal speech and language. No gross focal neurologic deficits are appreciated.  Skin:  Skin is warm, dry and intact. No rash noted. Psychiatric: Mood and affect are normal. Speech and behavior are normal.  ____________________________________________   LABS (all labs ordered are listed, but only abnormal results are displayed)  Labs Reviewed - No data to display ____________________________________________  EKG   ____________________________________________  RADIOLOGY   ____________________________________________   PROCEDURES   ____________________________________________   INITIAL IMPRESSION / ASSESSMENT AND PLAN / ED COURSE  Pertinent labs & imaging results that were available during my care of the patient were reviewed by me and considered in my medical decision making (see chart for details).  Patient with migrainous symptoms similar to previous. Had sudden onset pain but has had multiple headaches like this in the past.  Less likely to be intracranial hemorrhage secondary to history of multiple similar headaches as well as family history of headaches similar to this.  ----------------------------------------- 3:49 PM on 10/18/2015 -----------------------------------------  Patient saying the headache is relieved but now with itching and redness of the chest and bilateral arms. Denies any feelings of her throat closing or difficulty breathing. There is no stridor on exam or  respiratory distress. We'll give Benadryl.  ----------------------------------------- 4:18 PM on 10/18/2015 -----------------------------------------  Itching and redness relieved with Benadryl. Patient says she is on a Decadron before but she is suspecting that may be what helped her dramatically this time around. Also recommended the patient begin seeing a neurologist we'll give her follow-up information for the neurologist at the [facility]. I also decided to her that she may explore options down at [facility] where she also gets the majority of her healthcare.  ____________________________________________   FINAL CLINICAL IMPRESSION(S) / ED DIAGNOSES  Migraine headache. Resolved.    Orbie Pyo, MD 10/18/15 [phone]

## 2015-10-18 NOTE — ED Notes (Addendum)
Pt to ed with c/o headache x several days. Pt states she usually takes a "cocktail" of 4mg  dilaudid, phenergan supp, and 0.5mg  xanax,  Pt states she has taken 2 "cocktails" in the last 12 hours, but is now out of meds.   Pt reports she has a portacath if we need to access for blood or medication administration.

## 2015-10-18 NOTE — ED Notes (Signed)
Pt complains of a migraine," pt describes migraine as feeling like my other headaches", pt reports migraine has lasted for 24 hours with nausea and vomiting

## 2015-10-20 ENCOUNTER — Inpatient Hospital Stay: Payer: Medicaid Other | Attending: Family Medicine

## 2015-10-20 DIAGNOSIS — Z452 Encounter for adjustment and management of vascular access device: Secondary | ICD-10-CM | POA: Insufficient documentation

## 2015-10-20 DIAGNOSIS — K5090 Crohn's disease, unspecified, without complications: Secondary | ICD-10-CM | POA: Insufficient documentation

## 2015-10-25 ENCOUNTER — Inpatient Hospital Stay: Payer: Medicaid Other

## 2015-10-25 DIAGNOSIS — K5090 Crohn's disease, unspecified, without complications: Secondary | ICD-10-CM | POA: Diagnosis present

## 2015-10-25 DIAGNOSIS — Z95828 Presence of other vascular implants and grafts: Secondary | ICD-10-CM

## 2015-10-25 DIAGNOSIS — Z452 Encounter for adjustment and management of vascular access device: Secondary | ICD-10-CM | POA: Diagnosis not present

## 2015-10-25 MED ORDER — HEPARIN SOD (PORK) LOCK FLUSH 100 UNIT/ML IV SOLN
500.0000 [IU] | Freq: Once | INTRAVENOUS | Status: AC
  Administered 2015-10-25: 500 [IU] via INTRAVENOUS

## 2015-10-25 MED ORDER — SODIUM CHLORIDE 0.9 % IJ SOLN
10.0000 mL | INTRAMUSCULAR | Status: DC | PRN
  Administered 2015-10-25: 10 mL via INTRAVENOUS
  Filled 2015-10-25: qty 10

## 2015-10-28 ENCOUNTER — Ambulatory Visit (INDEPENDENT_AMBULATORY_CARE_PROVIDER_SITE_OTHER): Payer: Medicaid Other | Admitting: Family Medicine

## 2015-10-28 ENCOUNTER — Encounter: Payer: Self-pay | Admitting: Family Medicine

## 2015-10-28 VITALS — BP 128/84 | HR 96 | Temp 98.8°F | Resp 96 | Ht 62.0 in | Wt 137.0 lb

## 2015-10-28 DIAGNOSIS — J0111 Acute recurrent frontal sinusitis: Secondary | ICD-10-CM | POA: Diagnosis not present

## 2015-10-28 MED ORDER — LEVOFLOXACIN 500 MG PO TABS
500.0000 mg | ORAL_TABLET | Freq: Every day | ORAL | Status: DC
Start: 2015-10-28 — End: 2016-03-30

## 2015-10-28 MED ORDER — OXYMETAZOLINE HCL 0.05 % NA SOLN: 1.0000 | Freq: Two times a day (BID) | NASAL | Status: DC

## 2015-10-28 NOTE — Assessment & Plan Note (Signed)
Treat for infection given pt duration of symptoms. Treat with Levaquin given pt resistance to Augmentin. Recommend Afrin use since patient feels poorly with sudafed and mucinex.  Continue saline and netipot. If symptoms do not improve, she needs to return to see her ENT given existing sinus issues and need for surgery.

## 2015-10-28 NOTE — Progress Notes (Signed)
Subjective:    Patient ID: [name], female    DOB: [birth year], 29 y.o.   MRN: [id number]  HPI: [name] is a 29 y.o. female presenting on 10/28/2015 for Sinusitis   HPI  Pt presents for possible sinus infection. She had sinus surgery in July- colonized with group beta strep- cannot take Augmentin. She needs surgery again later on in the year per her ENT at [facility]. Symptoms of congestion and pressure x 3 weeks. Mild cough from drainage. Using flonase and neti pot.  Taking mucinex- makes her feel uncomfortable. Taking sudafed- makes her feel woozy.   Past Medical History  Diagnosis Date  . Crohn's colitis ([place])   . 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 Irritable bowel   . Multiple gastric ulcers   . History of multiple concussions   . Vertigo   . Depression   . Anxiety   . Bipolar 1 disorder ([place])   . Migraines     Current Outpatient Prescriptions on File Prior to Visit  Medication Sig  . butalbital-acetaminophen-caffeine (FIORICET) 50-325-40 MG tablet Take 1-2 tablets by mouth every 6 (six) hours as needed for headache. (Patient not taking: Reported on 10/28/2015)  . clonazePAM (KLONOPIN) 0.5 MG tablet Take 0.5 mg by mouth 2 (two) times daily as needed for anxiety. Reported on 10/28/2015  . dicyclomine (BENTYL) 10 MG capsule Take 10 mg by mouth. Reported on 10/28/2015  . fluticasone (FLONASE) 50 MCG/ACT nasal spray Place 2 sprays into the nose as needed. Reported on 10/28/2015  . promethazine (PHENERGAN) 25 MG tablet Take 25 mg by mouth. Reported on 10/28/2015   No current facility-administered medications on file prior to visit.    Review of Systems  Constitutional: Positive for chills.  HENT: Positive for congestion, postnasal drip and sinus pressure. Negative for ear pain, sneezing and sore throat.   Respiratory: Positive for cough. Negative for chest tightness and wheezing.   Cardiovascular: Negative for chest pain and palpitations.  Gastrointestinal:  Negative.  Negative for nausea, vomiting and diarrhea.  Musculoskeletal: Positive for neck pain.  Neurological: Positive for headaches.   Per HPI unless specifically indicated above     Objective:    BP 128/84 mmHg  Pulse 96  Temp(Src) 98.8 F (37.1 C) (Oral)  Resp 96  Ht 5\' 2"  (1.575 m)  Wt 137 lb (62.143 kg)  BMI 25.05 kg/m2  LMP 10/18/2015  Wt Readings from Last 3 Encounters:  10/28/15 137 lb (62.143 kg)  10/18/15 130 lb (58.968 kg)  05/05/15 130 lb (58.968 kg)    Physical Exam  Constitutional: She appears well-developed and well-nourished. No distress.  HENT:  Head: Normocephalic and atraumatic.  Right Ear: Hearing and tympanic membrane normal. Tympanic membrane is not erythematous and not bulging.  Left Ear: Hearing normal. Tympanic membrane is retracted. Tympanic membrane is not erythematous and not bulging.  Nose: Mucosal edema and rhinorrhea present. No sinus tenderness. Right sinus exhibits no maxillary sinus tenderness and no frontal sinus tenderness. Left sinus exhibits maxillary sinus tenderness and frontal sinus tenderness.  Mouth/Throat: Uvula is midline, oropharynx is clear and moist and mucous membranes are normal. No posterior oropharyngeal erythema.  Neck: Neck supple. No Brudzinski's sign and no Kernig's sign noted.  Cardiovascular: Normal rate, regular rhythm and normal heart sounds.   Pulmonary/Chest: Breath sounds normal. No accessory muscle usage. No tachypnea. No respiratory distress.  Lymphadenopathy:    She has no cervical adenopathy.   Results for orders placed or performed during the  hospital encounter of 05/05/15  CBC  Result Value Ref Range   WBC 8.0 3.6 - 11.0 K/uL   RBC 4.51 3.80 - 5.20 MIL/uL   Hemoglobin 13.0 12.0 - 16.0 g/dL   HCT 40.4 35.0 - 47.0 %   MCV 89.8 80.0 - 100.0 fL   MCH 28.9 26.0 - 34.0 pg   MCHC 32.2 32.0 - 36.0 g/dL   RDW 13.8 11.5 - 14.5 %   Platelets 199 150 - 440 K/uL  Basic metabolic panel  Result Value Ref Range     Sodium 139 135 - 145 mmol/L   Potassium 3.4 (L) 3.5 - 5.1 mmol/L   Chloride 108 101 - 111 mmol/L   CO2 25 22 - 32 mmol/L   Glucose, Bld 97 65 - 99 mg/dL   BUN 12 6 - 20 mg/dL   Creatinine, Ser 0.56 0.44 - 1.00 mg/dL   Calcium 8.1 (L) 8.9 - 10.3 mg/dL   GFR calc non Af Amer >60 >60 mL/min   GFR calc Af Amer >60 >60 mL/min   Anion gap 6 5 - 15      Assessment & Plan:   Problem List Items Addressed This Visit      Respiratory   Acute recurrent frontal sinusitis - Primary    Treat for infection given pt duration of symptoms. Treat with Levaquin given pt resistance to Augmentin. Recommend Afrin use since patient feels poorly with sudafed and mucinex.  Continue saline and netipot. If symptoms do not improve, she needs to return to see her ENT given existing sinus issues and need for surgery.       Relevant Medications   levofloxacin (LEVAQUIN) 500 MG tablet   oxymetazoline (AFRIN NASAL SPRAY) 0.05 % nasal spray      Meds ordered this encounter  Medications  . levofloxacin (LEVAQUIN) 500 MG tablet    Sig: Take 1 tablet (500 mg total) by mouth daily.    Dispense:  10 tablet    Refill:  0    Order Specific Question:  Supervising Provider    Answer:  Arlis Porta [phone]  . oxymetazoline (AFRIN NASAL SPRAY) 0.05 % nasal spray    Sig: Place 1 spray into both nostrils 2 (two) times daily.    Dispense:  30 mL    Refill:  0    Order Specific Question:  Supervising Provider    Answer:  Arlis Porta [id number]      Follow up plan: Return if symptoms worsen or fail to improve.

## 2015-10-28 NOTE — Patient Instructions (Signed)
You can use supportive care at home to help with your symptoms. Saline rinses and afrin nasal spray for decongestant as needed. Use Afrin for only 3 days only.   Take levaquin once daily for 10 days.  Please let me know if you symptoms are not getting better.

## 2015-11-26 ENCOUNTER — Emergency Department
Admission: EM | Admit: 2015-11-26 | Discharge: 2015-11-26 | Disposition: A | Discharge status: Home / Self Care | Payer: Medicaid Other | Attending: Emergency Medicine | Admitting: Emergency Medicine

## 2015-11-26 ENCOUNTER — Encounter: Payer: Self-pay | Admitting: Emergency Medicine

## 2015-11-26 DIAGNOSIS — Z79899 Other long term (current) drug therapy: Secondary | ICD-10-CM | POA: Insufficient documentation

## 2015-11-26 DIAGNOSIS — Y92009 Unspecified place in unspecified non-institutional (private) residence as the place of occurrence of the external cause: Secondary | ICD-10-CM | POA: Insufficient documentation

## 2015-11-26 DIAGNOSIS — H578 Other specified disorders of eye and adnexa: Secondary | ICD-10-CM | POA: Diagnosis present

## 2015-11-26 DIAGNOSIS — X58XXXA Exposure to other specified factors, initial encounter: Secondary | ICD-10-CM | POA: Diagnosis not present

## 2015-11-26 DIAGNOSIS — Z87891 Personal history of nicotine dependence: Secondary | ICD-10-CM | POA: Insufficient documentation

## 2015-11-26 DIAGNOSIS — Y998 Other external cause status: Secondary | ICD-10-CM | POA: Insufficient documentation

## 2015-11-26 DIAGNOSIS — S0501XA Injury of conjunctiva and corneal abrasion without foreign body, right eye, initial encounter: Secondary | ICD-10-CM | POA: Insufficient documentation

## 2015-11-26 DIAGNOSIS — Y9389 Activity, other specified: Secondary | ICD-10-CM | POA: Insufficient documentation

## 2015-11-26 DIAGNOSIS — Z792 Long term (current) use of antibiotics: Secondary | ICD-10-CM | POA: Insufficient documentation

## 2015-11-26 DIAGNOSIS — S0502XA Injury of conjunctiva and corneal abrasion without foreign body, left eye, initial encounter: Secondary | ICD-10-CM | POA: Insufficient documentation

## 2015-11-26 MED ORDER — TETRACAINE HCL 0.5 % OP SOLN
OPHTHALMIC | Status: AC
  Filled 2015-11-26: qty 2

## 2015-11-26 MED ORDER — FLUORESCEIN SODIUM 1 MG OP STRP
ORAL_STRIP | OPHTHALMIC | Status: AC
  Filled 2015-11-26: qty 1

## 2015-11-26 MED ORDER — EYE WASH OPHTH SOLN
OPHTHALMIC | Status: AC
  Filled 2015-11-26: qty 118

## 2015-11-26 MED ORDER — GENTAMICIN SULFATE 0.3 % OP SOLN: 1.0000 [drp] | OPHTHALMIC | Status: DC

## 2015-11-26 NOTE — ED Provider Notes (Signed)
[facility] Emergency Department Provider Note  ____________________________________________  Time seen: Approximately 5:22 PM  I have reviewed the triage vital signs and the nursing notes.   HISTORY  Chief Complaint Eye Problem    HPI [name] is a 30 y.o. female patient complaining of bilateral eye irritation for 1 week. Patient stated there is a foreign body sensation. Patient denies contact lens usage.  Patient stated done a lot of ceiling work at home. Patient states she's been using over-the-counter eyedrops with no relief. She denies any vision loss. Patient rates the pain as 8/10.   Past Medical History  Diagnosis Date  . Crohn's colitis ([place])   . Ulcerative (chronic) enterocolitis ([place])   . Irritable bowel   . Multiple gastric ulcers   . History of multiple concussions   . Vertigo   . Depression   . Anxiety   . Bipolar 1 disorder ([place])   . Migraines     Patient Active Problem List   Diagnosis Date Noted  . Acute recurrent frontal sinusitis 10/28/2015  . Anxiety and depression 08/30/2014  . Clinical trial participant 08/30/2014  . Poor venous access 06/12/2014  . Bowel disease, inflammatory 06/12/2014  . Suicidal ideation 06/12/2014    Past Surgical History  Procedure Laterality Date  . Rhinoplasty      Current Outpatient Rx  Name  Route  Sig  Dispense  Refill  . butalbital-acetaminophen-caffeine (FIORICET) 50-325-40 MG tablet   Oral   Take 1-2 tablets by mouth every 6 (six) hours as needed for headache. Patient not taking: Reported on 10/28/2015   20 tablet   0   . clonazePAM (KLONOPIN) 0.5 MG tablet   Oral   Take 0.5 mg by mouth 2 (two) times daily as needed for anxiety. Reported on 10/28/2015         . dicyclomine (BENTYL) 10 MG capsule   Oral   Take 10 mg by mouth. Reported on 10/28/2015         . fluticasone (FLONASE) 50 MCG/ACT nasal spray   Nasal   Place 2 sprays into the nose as needed. Reported on  10/28/2015         . gentamicin (GARAMYCIN) 0.3 % ophthalmic solution   Left Eye   Place 1 drop into the left eye every 4 (four) hours.   15 mL   0   . levofloxacin (LEVAQUIN) 500 MG tablet   Oral   Take 1 tablet (500 mg total) by mouth daily.   10 tablet   0   . oxymetazoline (AFRIN NASAL SPRAY) 0.05 % nasal spray   Each Nare   Place 1 spray into both nostrils 2 (two) times daily.   30 mL   0   . promethazine (PHENERGAN) 25 MG tablet   Oral   Take 25 mg by mouth. Reported on 10/28/2015           Allergies Buprenorphine hcl; Morphine; Origanum oil; Demerol; Imitrex; Morphine and related; Seroquel; Sumatriptan succinate; Seroquel ; Citalopram; and Other  Family History  Problem Relation Age of Onset  . Thyroid disease Mother   . Cancer Father 28    prostate cancer  . Thyroid disease Sister     Social History Social History  Substance Use Topics  . Smoking status: Former Research scientist (life sciences)  . Smokeless tobacco: None  . Alcohol Use: No    Review of Systems Constitutional: No fever/chills Eyes: No visual changes. Bilateral eye pain ENT: No sore throat. Cardiovascular:  Denies chest pain. Respiratory: Denies shortness of breath. Gastrointestinal: No abdominal pain.  No nausea, no vomiting.  No diarrhea.  No constipation. Genitourinary: Negative for dysuria. Musculoskeletal: Negative for back pain. Skin: Negative for rash. Neurological: Negative for headaches, focal weakness or numbness. Allergic/Immunilogical: See medication list    ____________________________________________   PHYSICAL EXAM:  VITAL SIGNS: ED Triage Vitals  Enc Vitals Group     BP 11/26/15 1656 133/106 mmHg     Pulse Rate 11/26/15 1656 67     Resp 11/26/15 1656 18     Temp 11/26/15 1656 98.4 F (36.9 C)     Temp Source 11/26/15 1656 Oral     SpO2 11/26/15 1656 99 %     Weight 11/26/15 1656 130 lb (58.968 kg)     Height 11/26/15 1656 5\' 2"  (1.575 m)     Head Cir --      Peak Flow --       Pain Score 11/26/15 1658 8     Pain Loc --      Pain Edu? --      Excl. in [place]? --     Constitutional: Alert and oriented. Well appearing and in no acute distress. Eyes: Conjunctivae are normal. PERRL. EOMI. fluoroscopy stain reveals bilateral cornea abrasions. Head: Atraumatic. Nose: No congestion/rhinnorhea. Mouth/Throat: Mucous membranes are moist.  Oropharynx non-erythematous. Neck: No stridor.  No cervical spine tenderness to palpation. Hematological/Lymphatic/Immunilogical: No cervical lymphadenopathy. Cardiovascular: Normal rate, regular rhythm. Grossly normal heart sounds.  Good peripheral circulation. Respiratory: Normal respiratory effort.  No retractions. Lungs CTAB. Gastrointestinal: Soft and nontender. No distention. No abdominal bruits. No CVA tenderness. Musculoskeletal: No lower extremity tenderness nor edema.  No joint effusions. Neurologic:  Normal speech and language. No gross focal neurologic deficits are appreciated. No gait instability. Skin:  Skin is warm, dry and intact. No rash noted. Psychiatric: Mood and affect are normal. Speech and behavior are normal.  ____________________________________________   LABS (all labs ordered are listed, but only abnormal results are displayed)  Labs Reviewed - No data to display ____________________________________________  EKG   ____________________________________________  RADIOLOGY   ____________________________________________   PROCEDURES  Procedure(s) performed: None  Critical Care performed: No  ____________________________________________   INITIAL IMPRESSION / ASSESSMENT AND PLAN / ED COURSE  Pertinent labs & imaging results that were available during my care of the patient were reviewed by me and considered in my medical decision making (see chart for details).  Bilateral cornea abrasions. Patient given discharge care instructions. Patient given a prescription for gentamicin eyedrops. Patient  advised to follow-up with the [place] [facility] if no improvement in 2-3 days.  ____________________________________________   FINAL CLINICAL IMPRESSION(S) / ED DIAGNOSES  Final diagnoses:  Bilateral corneal abrasions, initial encounter      Sable Feil, PA-C 11/26/15 [place], MD 11/26/15 2046

## 2015-11-26 NOTE — ED Notes (Signed)
Pt reports reddness and pain in both eyes for last week.  Pt sts eyedrops do not relieve pain.  Pt denies any chngs in vision.  NAD

## 2015-11-26 NOTE — Discharge Instructions (Signed)
Corneal Abrasion °The cornea is the clear covering at the front and center of the eye. When you look at the colored portion of the eye, you are looking through the cornea. It is a thin tissue made up of layers. The top layer is the most sensitive layer. A corneal abrasion happens if this layer is scratched or an injury causes it to come off.  °HOME CARE °· You may be given drops or a medicated cream. Use the medicine as told by your doctor. °· A pressure patch may be put over the eye. If this is done, follow your doctor's instructions for when to remove the patch. Do not drive or use machines while the eye patch is on. Judging distances is hard to do with a patch on. °· See your doctor for a follow-up exam if you are told to do so. It is very important that you keep this appointment. °GET HELP IF:  °· You have pain, are sensitive to light, and have a scratchy feeling in one eye or both eyes. °· Your pressure patch keeps getting loose. You can blink your eye under the patch. °· You have fluid coming from your eye or the lids stick together in the morning. °· You have the same symptoms in the morning that you did with the first abrasion. This could be days, weeks, or months after the first abrasion healed. °  °This information is not intended to replace advice given to you by your health care provider. Make sure you discuss any questions you have with your health care provider. °  °Document Released: 04/02/2008 Document Revised: 07/06/2015 Document Reviewed: 06/22/2013 °Elsevier Interactive Patient Education ©2016 Elsevier Inc. ° °

## 2015-11-26 NOTE — ED Notes (Signed)
Eye irritation and aching x 1 week.

## 2015-11-28 ENCOUNTER — Telehealth: Payer: Self-pay | Admitting: Hematology and Oncology

## 2015-11-28 ENCOUNTER — Telehealth: Payer: Self-pay | Admitting: Family Medicine

## 2015-11-28 NOTE — Telephone Encounter (Signed)
She anticipates her insurance will soon be changing and she will need to go to [facility] for further flushes. Can you please send a referral so that she can get scheduled there for flushes? Thanks.

## 2015-11-28 NOTE — Telephone Encounter (Signed)
*  EXTRANEOUS  - NOT A COCORAN PT*

## 2015-11-28 NOTE — Telephone Encounter (Signed)
This needs to go to her PCP as we were only flushing her port at request of provider, she is not a heme or oncology patient

## 2015-12-01 ENCOUNTER — Inpatient Hospital Stay: Payer: Medicaid Other

## 2015-12-01 ENCOUNTER — Inpatient Hospital Stay: Payer: Medicaid Other | Attending: Family Medicine

## 2015-12-01 DIAGNOSIS — K5090 Crohn's disease, unspecified, without complications: Secondary | ICD-10-CM | POA: Diagnosis present

## 2015-12-01 DIAGNOSIS — C801 Malignant (primary) neoplasm, unspecified: Secondary | ICD-10-CM

## 2015-12-01 DIAGNOSIS — Z452 Encounter for adjustment and management of vascular access device: Secondary | ICD-10-CM | POA: Insufficient documentation

## 2015-12-01 MED ORDER — SODIUM CHLORIDE 0.9% FLUSH
10.0000 mL | INTRAVENOUS | Status: DC | PRN
  Administered 2015-12-01: 10 mL via INTRAVENOUS
  Filled 2015-12-01: qty 10

## 2015-12-01 MED ORDER — HEPARIN SOD (PORK) LOCK FLUSH 100 UNIT/ML IV SOLN
500.0000 [IU] | Freq: Once | INTRAVENOUS | Status: AC
  Administered 2015-12-01: 500 [IU] via INTRAVENOUS

## 2015-12-01 MED ORDER — HEPARIN SOD (PORK) LOCK FLUSH 100 UNIT/ML IV SOLN
INTRAVENOUS | Status: AC
  Filled 2015-12-01: qty 5

## 2016-01-12 ENCOUNTER — Inpatient Hospital Stay: Payer: BC Managed Care – PPO

## 2016-01-20 ENCOUNTER — Inpatient Hospital Stay: Payer: BC Managed Care – PPO | Attending: Family Medicine

## 2016-01-20 VITALS — Resp 18

## 2016-01-20 DIAGNOSIS — K5090 Crohn's disease, unspecified, without complications: Secondary | ICD-10-CM | POA: Insufficient documentation

## 2016-01-20 DIAGNOSIS — Z95828 Presence of other vascular implants and grafts: Secondary | ICD-10-CM

## 2016-01-20 DIAGNOSIS — Z452 Encounter for adjustment and management of vascular access device: Secondary | ICD-10-CM | POA: Insufficient documentation

## 2016-01-20 MED ORDER — HEPARIN SOD (PORK) LOCK FLUSH 100 UNIT/ML IV SOLN
500.0000 [IU] | Freq: Once | INTRAVENOUS | Status: AC
  Administered 2016-01-20: 500 [IU] via INTRAVENOUS
  Filled 2016-01-20: qty 5

## 2016-01-20 MED ORDER — SODIUM CHLORIDE 0.9% FLUSH
10.0000 mL | INTRAVENOUS | Status: DC | PRN
  Administered 2016-01-20: 10 mL via INTRAVENOUS
  Filled 2016-01-20: qty 10

## 2016-02-16 ENCOUNTER — Ambulatory Visit: Payer: Medicaid Other | Admitting: Obstetrics and Gynecology

## 2016-02-23 ENCOUNTER — Inpatient Hospital Stay: Payer: BC Managed Care – PPO | Attending: Family Medicine

## 2016-02-26 ENCOUNTER — Ambulatory Visit
Admission: EM | Admit: 2016-02-26 | Discharge: 2016-02-26 | Disposition: A | Discharge status: Home / Self Care | Payer: BC Managed Care – PPO | Attending: Family Medicine | Admitting: Family Medicine

## 2016-02-26 DIAGNOSIS — R1084 Generalized abdominal pain: Secondary | ICD-10-CM

## 2016-02-26 DIAGNOSIS — N390 Urinary tract infection, site not specified: Secondary | ICD-10-CM | POA: Diagnosis not present

## 2016-02-26 LAB — URINALYSIS COMPLETE WITH MICROSCOPIC (ARMC ONLY)
BILIRUBIN URINE: NEGATIVE
GLUCOSE, UA: NEGATIVE mg/dL
Ketones, ur: NEGATIVE mg/dL
Nitrite: NEGATIVE
PH: 7 (ref 5.0–8.0)
Specific Gravity, Urine: 1.02 (ref 1.005–1.030)

## 2016-02-26 LAB — PREGNANCY, URINE: Preg Test, Ur: NEGATIVE

## 2016-02-26 MED ORDER — KETOROLAC TROMETHAMINE 60 MG/2ML IM SOLN: 60.0000 mg | Freq: Once | INTRAMUSCULAR | Status: DC

## 2016-02-26 MED ORDER — CIPROFLOXACIN HCL 500 MG PO TABS: 500.0000 mg | ORAL_TABLET | Freq: Two times a day (BID) | ORAL | Status: DC

## 2016-02-26 MED ORDER — ONDANSETRON 4 MG PO TBDP
4.0000 mg | ORAL_TABLET | Freq: Once | ORAL | Status: AC
  Administered 2016-02-26: 4 mg via ORAL

## 2016-02-26 MED ORDER — KETOROLAC TROMETHAMINE 60 MG/2ML IM SOLN
60.0000 mg | Freq: Once | INTRAMUSCULAR | Status: AC
  Administered 2016-02-26: 60 mg via INTRAMUSCULAR

## 2016-02-26 NOTE — ED Provider Notes (Signed)
CSN: [id number]     Arrival date & time 02/26/16  0806 History   First MD Initiated Contact with Patient 02/26/16 [phone]     Chief Complaint  Patient presents with  . Abdominal Pain   (Consider location/radiation/quality/duration/timing/severity/associated sxs/prior Treatment) HPI Comments: 30 yo female with a 1 day h/o diffuse abdominal pain but worse suprapubic and discomfort with urination since early this morning. Denies any fevers, chills, vomiting, diarrhea, constipation, hematuria, melena, hematochezia. States has a h/o Crohns disease but has been in remission for 2 years and current symptoms are different.   Patient is a 30 y.o. female presenting with abdominal pain. The history is provided by the patient.  Abdominal Pain   Past Medical History  Diagnosis Date  . Crohn's colitis ([place])   . Ulcerative (chronic) enterocolitis ([place])   . Irritable bowel   . Multiple gastric ulcers   . History of multiple concussions   . Vertigo   . Depression   . Anxiety   . Bipolar 1 disorder ([place])   . Migraines    Past Surgical History  Procedure Laterality Date  . Rhinoplasty     Family History  Problem Relation Age of Onset  . Thyroid disease Mother   . Cancer Father 44    prostate cancer  . Thyroid disease Sister    Social History  Substance Use Topics  . Smoking status: Former Research scientist (life sciences)  . Smokeless tobacco: None  . Alcohol Use: No   OB History    Gravida Para Term Preterm AB TAB SAB Ectopic Multiple Living   0 0 0 0 0 0 0 0 0 0      Review of Systems  Gastrointestinal: Positive for abdominal pain.    Allergies  Buprenorphine hcl; Morphine; Origanum oil; Demerol; Imitrex; Morphine and related; Seroquel; Sumatriptan succinate; Seroquel ; Citalopram; and Other  Home Medications   Prior to Admission medications   Medication Sig Start Date End Date Taking? Authorizing Provider  dicyclomine (BENTYL) 10 MG capsule Take 10 mg by mouth. Reported on 10/28/2015   Yes Historical  Provider, MD  fluticasone (FLONASE) 50 MCG/ACT nasal spray Place 2 sprays into the nose as needed. Reported on 10/28/2015   Yes Historical Provider, MD  promethazine (PHENERGAN) 25 MG tablet Take 25 mg by mouth. Reported on 10/28/2015 09/06/14  Yes Historical Provider, MD  butalbital-acetaminophen-caffeine (FIORICET) 50-325-40 MG tablet Take 1-2 tablets by mouth every 6 (six) hours as needed for headache. Patient not taking: Reported on 10/28/2015 10/18/15 10/17/16  Orbie Pyo, MD  ciprofloxacin (CIPRO) 500 MG tablet Take 1 tablet (500 mg total) by mouth every 12 (twelve) hours. 02/26/16   Norval Gable, MD  clonazePAM (KLONOPIN) 0.5 MG tablet Take 0.5 mg by mouth 2 (two) times daily as needed for anxiety. Reported on 10/28/2015    Historical Provider, MD  gentamicin (GARAMYCIN) 0.3 % ophthalmic solution Place 1 drop into the left eye every 4 (four) hours. 11/26/15   Sable Feil, PA-C  levofloxacin (LEVAQUIN) 500 MG tablet Take 1 tablet (500 mg total) by mouth daily. 10/28/15   Amy Overton Mam, NP  oxymetazoline (AFRIN NASAL SPRAY) 0.05 % nasal spray Place 1 spray into both nostrils 2 (two) times daily. 10/28/15   Amy Overton Mam, NP   Meds Ordered and Administered this Visit   Medications  ondansetron (ZOFRAN-ODT) disintegrating tablet 4 mg (4 mg Oral Given 02/26/16 0852)  ketorolac (TORADOL) injection 60 mg (60 mg Intramuscular Given 02/26/16 0853)    BP 113/85 mmHg  Pulse 90  Temp(Src) 97.7 F (36.5 C) (Tympanic)  Resp 17  Ht 5\' 2"  (1.575 m)  Wt 135 lb (61.236 kg)  BMI 24.69 kg/m2  SpO2 99%  LMP 02/17/2016 No data found.   Physical Exam  Constitutional: She appears well-developed and well-nourished. No distress.  Abdominal: Soft. Bowel sounds are normal. She exhibits no distension and no mass. There is tenderness (mild diffuse tenderness to palpation; no rebound or guarding). There is no rebound and no guarding.  Skin: She is not diaphoretic.  Nursing note and  vitals reviewed.   ED Course  Procedures (including critical care time)  Labs Review Labs Reviewed  URINALYSIS COMPLETEWITH MICROSCOPIC ([facility] ONLY) - Abnormal; Notable for the following:    Color, Urine STRAW (*)    APPearance CLOUDY (*)    Hgb urine dipstick 2+ (*)    Protein, ur TRACE (*)    Leukocytes, UA 2+ (*)    Bacteria, UA RARE (*)    Squamous Epithelial / LPF 0-5 (*)    All other components within normal limits  URINE CULTURE  PREGNANCY, URINE    Imaging Review No results found.   Visual Acuity Review  Right Eye Distance:   Left Eye Distance:   Bilateral Distance:    Right Eye Near:   Left Eye Near:    Bilateral Near:         MDM   1. UTI (lower urinary tract infection)   2. Generalized abdominal pain    Discharge Medication List as of 02/26/2016  9:35 AM    START taking these medications   Details  ciprofloxacin (CIPRO) 500 MG tablet Take 1 tablet (500 mg total) by mouth every 12 (twelve) hours., Starting 02/26/2016, Until Discontinued, Normal       1. Lab results and diagnosis reviewed with patient 2. rx as per orders above; reviewed possible side effects, interactions, risks and benefits  3. Recommend supportive treatment with increased fluids, otc analgesics prn 4. Patient given toradol 60mg  IM x1 and zofran 4mg  odt with improvement of symptoms 5. Follow-up prn if symptoms worsen or don't improve    Norval Gable, MD 02/26/16 [phone]

## 2016-02-26 NOTE — ED Notes (Signed)
Patient complains of lower abdominal pain with severe cramping. She states that this woke her up around 3am. She states that she has a history of Crohns. She states that she has been nausea and took a Zofran 1 hour ago.

## 2016-02-28 LAB — URINE CULTURE
Culture: NO GROWTH
Special Requests: NORMAL

## 2016-03-19 ENCOUNTER — Telehealth: Payer: Self-pay | Admitting: Obstetrics and Gynecology

## 2016-03-19 ENCOUNTER — Other Ambulatory Visit: Payer: Self-pay | Admitting: Obstetrics and Gynecology

## 2016-03-19 MED ORDER — FLUCONAZOLE 150 MG PO TABS: 150.0000 mg | ORAL_TABLET | Freq: Once | ORAL | Status: DC

## 2016-03-19 NOTE — Telephone Encounter (Signed)
Pt has had consistent yeast and bladder inf since she has had an IUD. She has an infection now. OTC cream has not working. Just had antibiotics Cipro for bladder infection and it caused a yeast infection. She is on schedule 6/2 to be seen by Mel[facility]. Can you send diflucan to [place]

## 2016-03-19 NOTE — Telephone Encounter (Signed)
pls advise

## 2016-03-21 ENCOUNTER — Ambulatory Visit: Payer: Medicaid Other | Admitting: Obstetrics and Gynecology

## 2016-03-30 ENCOUNTER — Encounter: Payer: Self-pay | Admitting: Obstetrics and Gynecology

## 2016-03-30 ENCOUNTER — Ambulatory Visit (INDEPENDENT_AMBULATORY_CARE_PROVIDER_SITE_OTHER): Payer: BC Managed Care – PPO | Admitting: Obstetrics and Gynecology

## 2016-03-30 VITALS — BP 120/76 | HR 98 | Ht 62.0 in | Wt 147.2 lb

## 2016-03-30 DIAGNOSIS — Z30431 Encounter for routine checking of intrauterine contraceptive device: Secondary | ICD-10-CM | POA: Diagnosis not present

## 2016-03-30 DIAGNOSIS — N938 Other specified abnormal uterine and vaginal bleeding: Secondary | ICD-10-CM | POA: Diagnosis not present

## 2016-03-30 DIAGNOSIS — R350 Frequency of micturition: Secondary | ICD-10-CM | POA: Diagnosis not present

## 2016-03-30 LAB — POCT URINALYSIS DIPSTICK
BILIRUBIN UA: NEGATIVE
GLUCOSE UA: NEGATIVE
KETONES UA: NEGATIVE
Nitrite, UA: NEGATIVE
PROTEIN UA: NEGATIVE
Spec Grav, UA: 1.01
Urobilinogen, UA: 0.2
pH, UA: 6

## 2016-03-30 NOTE — Progress Notes (Signed)
Subjective:     Patient ID: [name], female   DOB: [birth year], 30 y.o.   MRN: [id number]  HPI Reports irregular and some times extremely heavy vaginal bleeding the last two months. Concerned about IUD placement. Denies cramping. And reports regular menses since insertion. Is not currently taking any medications. Does reports some pain with sex. Also worried about weight gain in leu of staying very active and strict diet ([place]).  Review of Systems See above    Objective:   Physical Exam A&O x4  well groomed female in no current distress Filed Vitals:   03/30/16 1434  Height: 5\' 2"  (1.575 m)  Weight: 147 lb 3.2 oz (66.769 kg)  abdomen soft and non-tender Pelvic exam: normal external genitalia, vulva, vagina, cervix, uterus and adnexa, WET MOUNT done - results: negative for pathogens, normal epithelial cells, IUD string noted .    Assessment:     DUB with paragard Unexplained weight gain      Plan:     Counseled on normal causes of BTB on IUD and treatment options. Will consider and let me know what she wants to do if anything. Labs obtained.  May consider weight loss plan here in near future.  RTC for palvic u/s and as needed.  Barnes Florek [place], CNM

## 2016-03-31 LAB — COMPREHENSIVE METABOLIC PANEL
ALT: 9 IU/L (ref 0–32)
AST: 13 IU/L (ref 0–40)
Albumin/Globulin Ratio: 2.4 — ABNORMAL HIGH (ref 1.2–2.2)
Albumin: 4.5 g/dL (ref 3.5–5.5)
Alkaline Phosphatase: 76 IU/L (ref 39–117)
BUN/Creatinine Ratio: 18 (ref 9–23)
BUN: 12 mg/dL (ref 6–20)
CO2: 22 mmol/L (ref 18–29)
Calcium: 9.6 mg/dL (ref 8.7–10.2)
Chloride: 102 mmol/L (ref 96–106)
Creatinine, Ser: 0.67 mg/dL (ref 0.57–1.00)
GFR calc non Af Amer: 120 mL/min/{1.73_m2} (ref >59)
GFR, EST AFRICAN AMERICAN: 138 mL/min/{1.73_m2} (ref >59)
Globulin, Total: 1.9 g/dL (ref 1.5–4.5)
Glucose: 95 mg/dL (ref 65–99)
Potassium: 4.4 mmol/L (ref 3.5–5.2)
Sodium: 143 mmol/L (ref 134–144)
TOTAL PROTEIN: 6.4 g/dL (ref 6.0–8.5)

## 2016-03-31 LAB — THYROID PANEL WITH TSH
FREE THYROXINE INDEX: 2 (ref 1.2–4.9)
T3 Uptake Ratio: 28 % (ref 24–39)
T4 TOTAL: 7.2 ug/dL (ref 4.5–12.0)
TSH: 0.714 u[IU]/mL (ref 0.450–4.500)

## 2016-03-31 LAB — CBC
Hematocrit: 41.8 % (ref 34.0–46.6)
Hemoglobin: 14 g/dL (ref 11.1–15.9)
MCH: 30.3 pg (ref 26.6–33.0)
MCHC: 33.5 g/dL (ref 31.5–35.7)
MCV: 91 fL (ref 79–97)
PLATELETS: 182 10*3/uL (ref 150–379)
RBC: 4.62 x10E6/uL (ref 3.77–5.28)
RDW: 13.8 % (ref 12.3–15.4)
WBC: 8 10*3/uL (ref 3.4–10.8)

## 2016-03-31 LAB — IRON: IRON: 82 ug/dL (ref 27–159)

## 2016-03-31 LAB — VITAMIN D 25 HYDROXY (VIT D DEFICIENCY, FRACTURES): VIT D 25 HYDROXY: 25.4 ng/mL — AB (ref 30.0–100.0)

## 2016-04-03 ENCOUNTER — Ambulatory Visit: Payer: BC Managed Care – PPO

## 2016-04-03 ENCOUNTER — Ambulatory Visit (INDEPENDENT_AMBULATORY_CARE_PROVIDER_SITE_OTHER): Payer: BC Managed Care – PPO | Admitting: Obstetrics and Gynecology

## 2016-04-03 ENCOUNTER — Other Ambulatory Visit: Payer: BC Managed Care – PPO

## 2016-04-03 ENCOUNTER — Encounter: Payer: Self-pay | Admitting: Obstetrics and Gynecology

## 2016-04-03 VITALS — BP 118/96 | HR 100 | Ht 62.0 in | Wt 140.3 lb

## 2016-04-03 DIAGNOSIS — E559 Vitamin D deficiency, unspecified: Secondary | ICD-10-CM | POA: Diagnosis not present

## 2016-04-03 DIAGNOSIS — Z01419 Encounter for gynecological examination (general) (routine) without abnormal findings: Secondary | ICD-10-CM

## 2016-04-03 MED ORDER — VITAMIN D (ERGOCALCIFEROL) 1.25 MG (50000 UNIT) PO CAPS: 50000.0000 [IU] | ORAL_CAPSULE | ORAL | Status: DC

## 2016-04-03 NOTE — Patient Instructions (Signed)
Place annual gynecologic exam patient instructions here.

## 2016-04-03 NOTE — Progress Notes (Signed)
  Subjective:     [name] is a 30 y.o. female and is here for a comprehensive physical exam. The patient reports less bleeding since visit last week, reviewed labs and informed of vit D Def, and need for supplement. Also may desire starting weight loss program..  Social History   Social History  . Marital Status: Single    Spouse Name: N/A  . Number of Children: N/A  . Years of Education: N/A   Occupational History  . Not on file.   Social History Main Topics  . Smoking status: Former Research scientist (life sciences)  . Smokeless tobacco: Not on file  . Alcohol Use: No  . Drug Use: No  . Sexual Activity: Yes    Birth Control/ Protection: IUD   Other Topics Concern  . Not on file   Social History Narrative   Health Maintenance  Topic Date Due  . HIV Screening  09/23/2002  . INFLUENZA VACCINE  05/29/2016  . PAP SMEAR  03/28/2018  . TETANUS/TDAP  10/30/2019    The following portions of the patient's history were reviewed and updated as appropriate: allergies, current medications, past family history, past medical history, past social history, past surgical history and problem list.  Review of Systems Pertinent items noted in HPI and remainder of comprehensive ROS otherwise negative.   Objective:    General appearance: alert, cooperative and appears stated age Neck: no adenopathy, no carotid bruit, no JVD, supple, symmetrical, trachea midline and thyroid not enlarged, symmetric, no tenderness/mass/nodules Lungs: clear to auscultation bilaterally Breasts: normal appearance, no masses or tenderness Heart: regular rate and rhythm, S1, S2 normal, no murmur, click, rub or gallop Abdomen: soft, non-tender; bowel sounds normal; no masses,  no organomegaly Pelvic: cervix normal in appearance, external genitalia normal, no adnexal masses or tenderness, no cervical motion tenderness, rectovaginal septum normal, uterus normal size, shape, and consistency, vagina normal without discharge and IUD string  noted    Assessment:    Healthy female exam. DUB; IUD check; overweight; vit D def.       Plan:  Pelvic u/s scheduled for later today RX sent in for ergocalciferol 50,000 IU weekly.    See After Visit Summary for Counseling Recommendations

## 2016-04-04 ENCOUNTER — Other Ambulatory Visit: Payer: Self-pay | Admitting: Obstetrics and Gynecology

## 2016-04-05 ENCOUNTER — Inpatient Hospital Stay: Payer: BC Managed Care – PPO | Attending: Family Medicine

## 2016-04-05 LAB — CYTOLOGY - PAP

## 2016-04-13 ENCOUNTER — Ambulatory Visit (INDEPENDENT_AMBULATORY_CARE_PROVIDER_SITE_OTHER): Payer: BC Managed Care – PPO

## 2016-04-13 DIAGNOSIS — N938 Other specified abnormal uterine and vaginal bleeding: Secondary | ICD-10-CM

## 2016-04-16 ENCOUNTER — Telehealth: Payer: Self-pay | Admitting: Obstetrics and Gynecology

## 2016-04-16 NOTE — Telephone Encounter (Signed)
Patient called stating she needed the weight medication called in after her ultrasound. She uses the [facility] in [place].

## 2016-04-17 ENCOUNTER — Ambulatory Visit (INDEPENDENT_AMBULATORY_CARE_PROVIDER_SITE_OTHER): Payer: BC Managed Care – PPO | Admitting: Obstetrics and Gynecology

## 2016-04-17 ENCOUNTER — Other Ambulatory Visit: Payer: Self-pay | Admitting: Obstetrics and Gynecology

## 2016-04-17 VITALS — BP 116/87 | HR 67 | Wt 144.6 lb

## 2016-04-17 DIAGNOSIS — E663 Overweight: Secondary | ICD-10-CM

## 2016-04-17 MED ORDER — PHENTERMINE HCL 37.5 MG PO TABS
37.5000 mg | ORAL_TABLET | Freq: Every day | ORAL | Status: DC
Start: 2016-04-17 — End: 2016-04-17

## 2016-04-17 MED ORDER — CYANOCOBALAMIN 1000 MCG/ML IJ SOLN: 1000.0000 ug | INTRAMUSCULAR | Status: DC

## 2016-04-17 MED ORDER — CYANOCOBALAMIN 1000 MCG/ML IJ SOLN
1000.0000 ug | Freq: Once | INTRAMUSCULAR | Status: AC
  Administered 2016-04-17: 1000 ug via INTRAMUSCULAR

## 2016-04-17 MED ORDER — PHENTERMINE HCL 37.5 MG PO TABS: 37.5000 mg | ORAL_TABLET | Freq: Every day | ORAL | Status: DC

## 2016-04-17 NOTE — Progress Notes (Signed)
Patient ID: [name], female   DOB: 1986-07-07, 30 y.o.   MRN: [id number] Pt here to get weight, B/P, and B-12 injection (given in house) and for RX for  phentermine and B-12 of which was given to pt.

## 2016-04-17 NOTE — Telephone Encounter (Signed)
Done.KEC

## 2016-04-17 NOTE — Telephone Encounter (Signed)
Please let her know she will have to pick it up, and get B12 shot at same time, so make nurse visit.

## 2016-05-08 ENCOUNTER — Encounter: Payer: Self-pay | Admitting: Family Medicine

## 2016-05-15 ENCOUNTER — Ambulatory Visit (INDEPENDENT_AMBULATORY_CARE_PROVIDER_SITE_OTHER): Payer: BC Managed Care – PPO | Admitting: Obstetrics and Gynecology

## 2016-05-15 VITALS — BP 120/93 | HR 95 | Wt 140.4 lb

## 2016-05-15 DIAGNOSIS — E663 Overweight: Secondary | ICD-10-CM

## 2016-05-15 MED ORDER — CYANOCOBALAMIN 1000 MCG/ML IJ SOLN
1000.0000 ug | Freq: Once | INTRAMUSCULAR | Status: AC
  Administered 2016-05-15: 1000 ug via INTRAMUSCULAR

## 2016-05-15 NOTE — Progress Notes (Signed)
Patient ID: [name], female   DOB: [birth year], 30 y.o.   MRN: [id number] Pt presents for weight, B/P, B-12 injection. No side effects of medication-Phentermine, or B-12.  Weight loss of 4 lbs. Encouraged eating healthy and exercise.  Pt states her B/P normally fluctuates from low to high. Pt states her blood sugars have tendency to drop also. Spoke with pt about eating small frequent meals and to increase water intake.

## 2016-05-17 ENCOUNTER — Inpatient Hospital Stay: Payer: BC Managed Care – PPO | Attending: Family Medicine

## 2016-06-12 ENCOUNTER — Ambulatory Visit: Payer: BC Managed Care – PPO

## 2016-06-19 ENCOUNTER — Telehealth: Payer: Self-pay | Admitting: *Deleted

## 2016-06-19 ENCOUNTER — Ambulatory Visit (INDEPENDENT_AMBULATORY_CARE_PROVIDER_SITE_OTHER): Payer: BC Managed Care – PPO | Admitting: Obstetrics and Gynecology

## 2016-06-19 ENCOUNTER — Inpatient Hospital Stay: Payer: BC Managed Care – PPO | Attending: Oncology

## 2016-06-19 VITALS — BP 124/90 | HR 105 | Wt 129.3 lb

## 2016-06-19 DIAGNOSIS — E663 Overweight: Secondary | ICD-10-CM | POA: Diagnosis not present

## 2016-06-19 DIAGNOSIS — Z452 Encounter for adjustment and management of vascular access device: Secondary | ICD-10-CM | POA: Insufficient documentation

## 2016-06-19 DIAGNOSIS — Z95828 Presence of other vascular implants and grafts: Secondary | ICD-10-CM

## 2016-06-19 MED ORDER — HEPARIN SOD (PORK) LOCK FLUSH 100 UNIT/ML IV SOLN
500.0000 [IU] | Freq: Once | INTRAVENOUS | Status: AC
Start: 2016-06-19 — End: 2016-06-19
  Administered 2016-06-19: 500 [IU] via INTRAVENOUS

## 2016-06-19 MED ORDER — SODIUM CHLORIDE 0.9% FLUSH
10.0000 mL | INTRAVENOUS | Status: DC | PRN
  Administered 2016-06-19: 10 mL via INTRAVENOUS
  Filled 2016-06-19: qty 10

## 2016-06-19 MED ORDER — CYANOCOBALAMIN 1000 MCG/ML IJ SOLN
1000.0000 ug | Freq: Once | INTRAMUSCULAR | Status: AC
  Administered 2016-06-19: 1000 ug via INTRAMUSCULAR

## 2016-06-19 NOTE — Progress Notes (Signed)
Pt presents for weight, B/P, B-12 injection. No side effects of medication-Phentermine, or B-12.  Weight loss of  11 lbs. Encouraged eating healthy and exercise. Pt has also been under a tremendous amount of stress. Her friend has left the home and he is on drugs, living in [place]. B/P 124/90. Pt states she feels fine.

## 2016-06-19 NOTE — Telephone Encounter (Signed)
Per Dr. Grayland Ormond, schedule port flushes every 6 weeks, follow up with [facility] in October to establish care for port flush maintenance. Pt was last seen by Magda Paganini in Apr 2016.

## 2016-06-28 ENCOUNTER — Inpatient Hospital Stay: Payer: BC Managed Care – PPO

## 2016-07-20 ENCOUNTER — Encounter: Payer: Self-pay | Admitting: Obstetrics and Gynecology

## 2016-07-20 ENCOUNTER — Ambulatory Visit (INDEPENDENT_AMBULATORY_CARE_PROVIDER_SITE_OTHER): Payer: BC Managed Care – PPO | Admitting: Obstetrics and Gynecology

## 2016-07-20 VITALS — BP 128/84 | HR 88 | Ht 61.0 in | Wt 134.0 lb

## 2016-07-20 DIAGNOSIS — E663 Overweight: Secondary | ICD-10-CM

## 2016-07-20 MED ORDER — PHENTERMINE HCL 37.5 MG PO TABS: 37.5000 mg | ORAL_TABLET | Freq: Every day | ORAL | 2 refills | Status: DC

## 2016-07-20 NOTE — Progress Notes (Signed)
SUBJECTIVE:  30 y.o. here for follow-up weight loss visit, previously seen 4 weeks ago. Denies any concerns and feels like medication has worked, desires to continue on meds to get 15 more pounds off. Has lost 15# to date.  OBJECTIVE:  BP 128/84   Pulse 88   Ht 5\' 1"  (1.549 m)   Wt 134 lb (60.8 kg)   LMP 07/06/2016   BMI 25.32 kg/m   Body mass index is 25.32 kg/m. Patient appears well. ASSESSMENT:  Overweight- responding well to weight loss plan PLAN:  To continue with current medications. B12 1050mcg/ml injection given RTC in 4 weeks as planned  Claudetta Sallie Bloomfield, CNM

## 2016-08-14 ENCOUNTER — Inpatient Hospital Stay: Payer: BC Managed Care – PPO

## 2016-08-17 ENCOUNTER — Inpatient Hospital Stay: Payer: BC Managed Care – PPO | Attending: Internal Medicine

## 2016-08-17 DIAGNOSIS — Z452 Encounter for adjustment and management of vascular access device: Secondary | ICD-10-CM | POA: Insufficient documentation

## 2016-08-17 DIAGNOSIS — Z95828 Presence of other vascular implants and grafts: Secondary | ICD-10-CM

## 2016-08-17 MED ORDER — SODIUM CHLORIDE 0.9% FLUSH
10.0000 mL | INTRAVENOUS | Status: DC | PRN
  Administered 2016-08-17: 10 mL via INTRAVENOUS
  Filled 2016-08-17: qty 10

## 2016-08-17 MED ORDER — HEPARIN SOD (PORK) LOCK FLUSH 100 UNIT/ML IV SOLN
500.0000 [IU] | Freq: Once | INTRAVENOUS | Status: AC
  Administered 2016-08-17: 500 [IU] via INTRAVENOUS

## 2016-08-21 ENCOUNTER — Ambulatory Visit (INDEPENDENT_AMBULATORY_CARE_PROVIDER_SITE_OTHER): Payer: BC Managed Care – PPO | Admitting: Obstetrics and Gynecology

## 2016-08-21 VITALS — BP 122/93 | HR 100 | Ht 61.0 in | Wt 127.2 lb

## 2016-08-21 DIAGNOSIS — E663 Overweight: Secondary | ICD-10-CM

## 2016-08-21 MED ORDER — CYANOCOBALAMIN 1000 MCG/ML IJ SOLN
1000.0000 ug | Freq: Once | INTRAMUSCULAR | Status: AC
  Administered 2016-08-21: 1000 ug via INTRAMUSCULAR

## 2016-08-21 NOTE — Progress Notes (Signed)
Pt presents for weight, B/P, B-12 injection. No side effects of medication-Phentermine, or B-12.  Weight loss of _7_ lbs. Encouraged eating healthy and exercise. Pt forgot B-12 medication so this was provided in house.

## 2016-08-23 ENCOUNTER — Encounter: Payer: Self-pay | Admitting: *Deleted

## 2016-08-23 ENCOUNTER — Ambulatory Visit
Admission: EM | Admit: 2016-08-23 | Discharge: 2016-08-23 | Disposition: A | Discharge status: Home / Self Care | Payer: BC Managed Care – PPO | Attending: Family Medicine | Admitting: Family Medicine

## 2016-08-23 DIAGNOSIS — R05 Cough: Secondary | ICD-10-CM | POA: Diagnosis not present

## 2016-08-23 DIAGNOSIS — J069 Acute upper respiratory infection, unspecified: Secondary | ICD-10-CM | POA: Diagnosis not present

## 2016-08-23 DIAGNOSIS — R059 Cough, unspecified: Secondary | ICD-10-CM

## 2016-08-23 LAB — RAPID STREP SCREEN (MED CTR MEBANE ONLY): Streptococcus, Group A Screen (Direct): NEGATIVE

## 2016-08-23 MED ORDER — BENZONATATE 200 MG PO CAPS: 200.0000 mg | ORAL_CAPSULE | Freq: Three times a day (TID) | ORAL | 0 refills | Status: DC | PRN

## 2016-08-23 NOTE — ED Triage Notes (Signed)
Non-productive cough since Tuesday. Denies other symptoms.

## 2016-08-23 NOTE — ED Provider Notes (Signed)
[facility]    CSN: [id number] Arrival date & time: 08/23/16  1742     History   Chief Complaint Chief Complaint  Patient presents with  . Cough    HPI [name] is a 30 y.o. female.   Patient's here because of a progressive cough and nasal congestion. She states her son was sick since Sunday. She became sick Tuesday night and today is Thursday. She feels that the cough is getting worse so she is concerned about that. She reports having a history of Crohn's disease she's been basically stable since about 2-3 weeks before her son was born. She still has a Port-A-Cath due to the lack of availability of IV access. She has not checked on a regular basis. She denies smoking. She has a past medical history of anxiety and bipolar disease closed disease depression and history of multiple concussions and IBS. She denies smoking now but she is a former smoker. She is gravida 1 para 1. No known drug allergies. Mother with history thyroid disease father prostate cancer. Her sons has currently a URI.   The history is provided by the patient.  Cough  Cough characteristics:  Non-productive Duration:  3 days Timing:  Constant Progression:  Worsening Chronicity:  New Context: sick contacts   Context: not smoke exposure   Relieved by:  Nothing Worsened by:  Nothing Ineffective treatments:  Steam and cough suppressants Associated symptoms: rhinorrhea and sore throat     Past Medical History:  Diagnosis Date  . Anxiety   . Bipolar 1 disorder ([place])   . Crohn's colitis ([place])   . Depression   . History of multiple concussions   . Irritable bowel   . Migraines   . Multiple gastric ulcers   . 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 Vertigo     Patient Active Problem List   Diagnosis Date Noted  . Anxiety and depression 08/30/2014  . Poor venous access 06/12/2014  . Bowel disease, inflammatory 06/12/2014    Past Surgical History:  Procedure Laterality Date  .  RHINOPLASTY      OB History    Gravida Para Term Preterm AB Living   1 0 0 0 0 1   SAB TAB Ectopic Multiple Live Births   0 0 0 0 1       Home Medications    Prior to Admission medications   Medication Sig Start Date End Date Taking? Authorizing Provider  cyanocobalamin (,VITAMIN B-12,) 1000 MCG/ML injection Inject 1 mL (1,000 mcg total) into the muscle every 30 (thirty) days. 04/17/16  Yes Melody N Shambley, CNM  PARAGARD INTRAUTERINE COPPER IU by Intrauterine route.   Yes Historical Provider, MD  benzonatate (TESSALON) 200 MG capsule Take 1 capsule (200 mg total) by mouth 3 (three) times daily as needed for cough. 08/23/16   Frederich Cha, MD  fluconazole (DIFLUCAN) 150 MG tablet  03/19/16   Historical Provider, MD  ondansetron (ZOFRAN-ODT) 4 MG disintegrating tablet  03/05/16   Historical Provider, MD  phentermine (ADIPEX-P) 37.5 MG tablet Take 1 tablet (37.5 mg total) by mouth daily before breakfast. 07/20/16   Melody N Shambley, CNM  promethazine (PHENERGAN) 25 MG tablet  03/05/16   Historical Provider, MD  Vitamin D, Ergocalciferol, (DRISDOL) 50000 units CAPS capsule Take 1 capsule (50,000 Units total) by mouth every 7 (seven) days. 04/03/16   Melody Rockney Ghee, CNM    Family History Family History  Problem Relation Age of Onset  . Thyroid  disease Mother   . Cancer Father 2    prostate cancer  . Thyroid disease Sister     Social History Social History  Substance Use Topics  . Smoking status: Former Research scientist (life sciences)  . Smokeless tobacco: Never Used  . Alcohol use No     Allergies   Buprenorphine hcl; Morphine; Origanum oil; Demerol [meperidine]; Imitrex [sumatriptan]; Morphine and related; Seroquel  [quetiapine]; Seroquel [quetiapine fumarate]; Sumatriptan succinate; Citalopram; and Other   Review of Systems Review of Systems  Constitutional: Negative.   HENT: Positive for congestion, rhinorrhea and sore throat.   Respiratory: Positive for cough.   All other systems reviewed and  are negative.    Physical Exam Triage Vital Signs ED Triage Vitals  Enc Vitals Group     BP 08/23/16 1827 135/81     Pulse Rate 08/23/16 1827 100     Resp 08/23/16 1827 16     Temp 08/23/16 1827 99.3 F (37.4 C)     Temp Source 08/23/16 1827 Oral     SpO2 08/23/16 1827 100 %     Weight 08/23/16 1829 127 lb (57.6 kg)     Height 08/23/16 1829 5\' 2"  (1.575 m)     Head Circumference --      Peak Flow --      Pain Score --      Pain Loc --      Pain Edu? --      Excl. in [place]? --    No data found.   Updated Vital Signs BP 135/81 (BP Location: Left Arm)   Pulse 100   Temp 99.3 F (37.4 C) (Oral)   Resp 16   Ht 5\' 2"  (1.575 m)   Wt 127 lb (57.6 kg)   LMP 07/31/2016 (Exact Date)   SpO2 100%   BMI 23.23 kg/m   Visual Acuity Right Eye Distance:   Left Eye Distance:   Bilateral Distance:    Right Eye Near:   Left Eye Near:    Bilateral Near:     Physical Exam  Constitutional: She is oriented to person, place, and time. She appears well-developed and well-nourished. No distress.  HENT:  Head: Normocephalic.  Right Ear: Hearing, tympanic membrane, external ear and ear canal normal.  Left Ear: Hearing, tympanic membrane and ear canal normal.  Nose: Rhinorrhea present. Right sinus exhibits no maxillary sinus tenderness and no frontal sinus tenderness. Left sinus exhibits no maxillary sinus tenderness.  Mouth/Throat: Uvula is midline. Posterior oropharyngeal erythema present.  Eyes: Pupils are equal, round, and reactive to light.  Neck: Normal range of motion. Neck supple.  Cardiovascular: Normal rate, regular rhythm and normal heart sounds.   Pulmonary/Chest: Effort normal.  Port-A-Cath present over the right chest  Musculoskeletal: Normal range of motion. She exhibits no edema.  Lymphadenopathy:    She has cervical adenopathy.  Neurological: She is alert and oriented to person, place, and time. She has normal reflexes.  Skin: Skin is warm and dry. She is not  diaphoretic.  Psychiatric: She has a normal mood and affect.  Vitals reviewed.    UC Treatments / Results  Labs (all labs ordered are listed, but only abnormal results are displayed) Labs Reviewed  RAPID STREP SCREEN (NOT AT [facility])  CULTURE, GROUP A STREP [facility])    EKG  EKG Interpretation None       Radiology No results found.  Procedures Procedures (including critical care time)  Medications Ordered in UC Medications - No data to display  Initial Impression / Assessment and Plan / UC Course  I have reviewed the triage vital signs and the nursing notes.  Pertinent labs & imaging results that were available during my care of the patient were reviewed by me and considered in my medical decision making (see chart for details). Results for orders placed or performed during the hospital encounter of 08/23/16  Rapid strep screen  Result Value Ref Range   Streptococcus, Group A Screen (Direct) NEGATIVE NEGATIVE   Clinical Course    At this point time patient would not appear to need antibiotics. Will give her a prescription for Tessalon Perles for cough as needed follow-up PCP and one week if not better. Did give a work note for today and tomorrow. I did state to her that I would treat her and her child with antibiotics if any one of them were positive for strep  Final Clinical Impressions(s) / UC Diagnoses   Final diagnoses:  Cough  Upper respiratory tract infection, unspecified type    New Prescriptions Discharge Medication List as of 08/23/2016  7:33 PM    START taking these medications   Details  benzonatate (TESSALON) 200 MG capsule Take 1 capsule (200 mg total) by mouth 3 (three) times daily as needed for cough., Starting Thu 08/23/2016, Normal        Note: This dictation was prepared with Dragon dictation along with smaller phrase technology. Any transcriptional errors that result from this process are unintentional.   Frederich Cha, MD 08/23/16 [phone]

## 2016-08-28 ENCOUNTER — Telehealth: Payer: Self-pay

## 2016-08-28 NOTE — Telephone Encounter (Signed)
Spoke to patient s/s haven't changed and she has a f/u appt scheduled with her PCP.

## 2016-08-28 NOTE — Telephone Encounter (Signed)
Courtesy call back completed today for patients recent visit at [facility]. Patient did not answer, left message on voicemail to call back with any questions or concerns.   

## 2016-09-18 ENCOUNTER — Ambulatory Visit: Payer: BC Managed Care – PPO

## 2016-09-27 ENCOUNTER — Ambulatory Visit
Admission: EM | Admit: 2016-09-27 | Discharge: 2016-09-27 | Discharge status: Against Medical Advice | Payer: BC Managed Care – PPO

## 2016-10-01 ENCOUNTER — Inpatient Hospital Stay: Payer: BC Managed Care – PPO | Attending: Internal Medicine

## 2016-10-01 DIAGNOSIS — Z452 Encounter for adjustment and management of vascular access device: Secondary | ICD-10-CM | POA: Insufficient documentation

## 2016-10-01 DIAGNOSIS — Z95828 Presence of other vascular implants and grafts: Secondary | ICD-10-CM

## 2016-10-01 MED ORDER — SODIUM CHLORIDE 0.9% FLUSH
10.0000 mL | INTRAVENOUS | Status: DC | PRN
  Administered 2016-10-01: 10 mL via INTRAVENOUS
  Filled 2016-10-01: qty 10

## 2016-10-01 MED ORDER — HEPARIN SOD (PORK) LOCK FLUSH 100 UNIT/ML IV SOLN
500.0000 [IU] | Freq: Once | INTRAVENOUS | Status: AC
  Administered 2016-10-01: 500 [IU] via INTRAVENOUS

## 2016-10-13 ENCOUNTER — Emergency Department
Admission: EM | Admit: 2016-10-13 | Discharge: 2016-10-13 | Disposition: A | Discharge status: Home / Self Care | Payer: BC Managed Care – PPO | Attending: Emergency Medicine | Admitting: Emergency Medicine

## 2016-10-13 ENCOUNTER — Encounter: Payer: Self-pay | Admitting: Emergency Medicine

## 2016-10-13 DIAGNOSIS — Z87891 Personal history of nicotine dependence: Secondary | ICD-10-CM | POA: Insufficient documentation

## 2016-10-13 DIAGNOSIS — Z79899 Other long term (current) drug therapy: Secondary | ICD-10-CM | POA: Diagnosis not present

## 2016-10-13 DIAGNOSIS — G43809 Other migraine, not intractable, without status migrainosus: Secondary | ICD-10-CM | POA: Diagnosis not present

## 2016-10-13 DIAGNOSIS — G43909 Migraine, unspecified, not intractable, without status migrainosus: Secondary | ICD-10-CM | POA: Diagnosis present

## 2016-10-13 MED ORDER — DIPHENHYDRAMINE HCL 50 MG/ML IJ SOLN
25.0000 mg | Freq: Once | INTRAMUSCULAR | Status: AC
  Administered 2016-10-13: 25 mg via INTRAVENOUS
  Filled 2016-10-13: qty 1

## 2016-10-13 MED ORDER — HEPARIN SOD (PORK) LOCK FLUSH 100 UNIT/ML IV SOLN
500.0000 [IU] | Freq: Once | INTRAVENOUS | Status: AC
  Administered 2016-10-13: 500 [IU] via INTRAVENOUS

## 2016-10-13 MED ORDER — HEPARIN SOD (PORK) LOCK FLUSH 10 UNIT/ML IV SOLN
INTRAVENOUS | Status: AC
  Filled 2016-10-13: qty 1

## 2016-10-13 MED ORDER — HEPARIN SOD (PORK) LOCK FLUSH 100 UNIT/ML IV SOLN
INTRAVENOUS | Status: AC
  Administered 2016-10-13: 500 [IU] via INTRAVENOUS
  Filled 2016-10-13: qty 5

## 2016-10-13 MED ORDER — SODIUM CHLORIDE 0.9 % IV SOLN
1000.0000 mL | Freq: Once | INTRAVENOUS | Status: AC
  Administered 2016-10-13: 1000 mL via INTRAVENOUS

## 2016-10-13 MED ORDER — KETOROLAC TROMETHAMINE 30 MG/ML IJ SOLN
30.0000 mg | Freq: Once | INTRAMUSCULAR | Status: AC
Start: 2016-10-13 — End: 2016-10-13
  Administered 2016-10-13: 30 mg via INTRAVENOUS
  Filled 2016-10-13: qty 1

## 2016-10-13 MED ORDER — HYDROMORPHONE HCL 1 MG/ML IJ SOLN
1.0000 mg | Freq: Once | INTRAMUSCULAR | Status: AC
  Administered 2016-10-13: 1 mg via INTRAVENOUS
  Filled 2016-10-13: qty 1

## 2016-10-13 MED ORDER — PROMETHAZINE HCL 25 MG/ML IJ SOLN
25.0000 mg | Freq: Once | INTRAMUSCULAR | Status: AC
  Administered 2016-10-13: 25 mg via INTRAVENOUS
  Filled 2016-10-13: qty 1

## 2016-10-13 MED ORDER — BUTALBITAL-APAP-CAFFEINE 50-325-40 MG PO TABS: 1.0000 | ORAL_TABLET | Freq: Four times a day (QID) | ORAL | 0 refills | Status: AC | PRN

## 2016-10-13 MED ORDER — METOCLOPRAMIDE HCL 5 MG/ML IJ SOLN
20.0000 mg | Freq: Once | INTRAVENOUS | Status: AC
  Administered 2016-10-13: 20 mg via INTRAVENOUS
  Filled 2016-10-13: qty 4

## 2016-10-13 NOTE — ED Triage Notes (Signed)
Patient to ER for migraine. Has long history of the same. +Photosensitivity. Patient has port for IV access if needed.

## 2016-10-13 NOTE — ED Provider Notes (Signed)
[facility] Emergency Department Provider Note   ____________________________________________    I have reviewed the triage vital signs and the nursing notes.   HISTORY  Chief Complaint Migraine     HPI [name] is a 30 y.o. female who presents with complaints of migraine headache. Patient has a long history of migraine headaches and reports this is the same as multiple prior episodes. She denies fevers or chills. No neck pain. No neuro deficits. Mild nausea but no vomiting. This is been going on for greater than 24 hours. She reports typically she is given Dilaudid and/or Phenergan for her migraines because that is all that works. She reports using rectal suppositories at home without relief   Past Medical History:  Diagnosis Date  . Anxiety   . Bipolar 1 disorder ([place])   . Crohn's colitis ([place])   . Depression   . History of multiple concussions   . Irritable bowel   . Migraines   . Multiple gastric ulcers   . 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 Vertigo     Patient Active Problem List   Diagnosis Date Noted  . Anxiety and depression 08/30/2014  . Poor venous access 06/12/2014  . Bowel disease, inflammatory 06/12/2014    Past Surgical History:  Procedure Laterality Date  . RHINOPLASTY      Prior to Admission medications   Medication Sig Start Date End Date Taking? Authorizing Provider  benzonatate (TESSALON) 200 MG capsule Take 1 capsule (200 mg total) by mouth 3 (three) times daily as needed for cough. 08/23/16  Yes Frederich Cha, MD  cyanocobalamin (,VITAMIN B-12,) 1000 MCG/ML injection Inject 1 mL (1,000 mcg total) into the muscle every 30 (thirty) days. 04/17/16  Yes Melody N Shambley, CNM  ondansetron (ZOFRAN-ODT) 4 MG disintegrating tablet Take 4 mg by mouth every 8 (eight) hours as needed.  03/05/16  Yes Historical Provider, MD  promethazine (PHENERGAN) 25 MG tablet Take 25 mg by mouth every 4 (four) hours as needed.   03/05/16  Yes Historical Provider, MD  Vitamin D, Ergocalciferol, (DRISDOL) 50000 units CAPS capsule Take 1 capsule (50,000 Units total) by mouth every 7 (seven) days. Patient taking differently: Take 50,000 Units by mouth every 7 (seven) days. Sundays or mondays 04/03/16  Yes Melody N Shambley, CNM  PARAGARD INTRAUTERINE COPPER IU by Intrauterine route.    Historical Provider, MD  phentermine (ADIPEX-P) 37.5 MG tablet Take 1 tablet (37.5 mg total) by mouth daily before breakfast. Patient not taking: Reported on 10/13/2016 07/20/16   Melody N Shambley, CNM     Allergies Buprenorphine hcl; Morphine; Origanum oil; Demerol [meperidine]; Imitrex [sumatriptan]; Morphine and related; Seroquel  [quetiapine]; Seroquel [quetiapine fumarate]; Sumatriptan succinate; Citalopram; and Other  Family History  Problem Relation Age of Onset  . Thyroid disease Mother   . Cancer Father 24    prostate cancer  . Thyroid disease Sister     Social History Social History  Substance Use Topics  . Smoking status: Former Research scientist (life sciences)  . Smokeless tobacco: Never Used  . Alcohol use No    Review of Systems  Constitutional: No fever/chills Eyes: No visual changes. Light sensitivity ENT: No Neck pain Cardiovascular: Denies chest pain.  Gastrointestinal:  no vomiting.    Musculoskeletal: Negative for muscle aches Skin: Negative for rash. Neurological: Negative forFocal weakness  10-point ROS otherwise negative.  ____________________________________________   PHYSICAL EXAM:  VITAL SIGNS: ED Triage Vitals  Enc Vitals Group     BP  10/13/16 1340 (!) 141/96     Pulse Rate 10/13/16 1340 85     Resp 10/13/16 1340 18     Temp 10/13/16 1340 97.6 F (36.4 C)     Temp Source 10/13/16 1340 Oral     SpO2 10/13/16 1340 99 %     Weight 10/13/16 1340 130 lb (59 kg)     Height 10/13/16 1340 5\' 2"  (1.575 m)     Head Circumference --      Peak Flow --      Pain Score 10/13/16 1342 10     Pain Loc --      Pain Edu?  --      Excl. in [place]? --     Constitutional: Alert and oriented. Lying in darkened room shielding her eyes Eyes: Conjunctivae are normal.  Head: Atraumatic. Nose: No congestion/rhinnorhea. Mouth/Throat: Mucous membranes are moist.   Neck:  Painless ROM Cardiovascular: Normal rate, regular rhythm.  Good peripheral circulation. Respiratory: Normal respiratory effort.  No retractions.  Musculoskeletal: Warm and well perfused Neurologic:  Normal speech and language. No gross focal neurologic deficits are appreciated.  Skin:  Skin is warm, dry and intact. No rash noted. Psychiatric: Mood and affect are normal. Speech and behavior are normal.  ____________________________________________   LABS (all labs ordered are listed, but only abnormal results are displayed)  Labs Reviewed - No data to display ____________________________________________  EKG  None ____________________________________________  RADIOLOGY  None ____________________________________________   PROCEDURES  Procedure(s) performed: No    Critical Care performed: No ____________________________________________   INITIAL IMPRESSION / ASSESSMENT AND PLAN / ED COURSE  Pertinent labs & imaging results that were available during my care of the patient were reviewed by me and considered in my medical decision making (see chart for details).  Patient presents with her typical migraine symptoms. I'm hesitant to give Dilaudid and Phenergan, we'll try Benadryl, Reglan and Toradol first  Clinical Course   ----------------------------------------- 4:44 PM on 10/13/2016 -----------------------------------------  Patient reports no improvement with initial treatment ____________________________________________ ----------------------------------------- 7:57 PM on 10/13/2016 -----------------------------------------  Patient had significant relief after her usual treatment just to go home, I feel this  appropriate  FINAL CLINICAL IMPRESSION(S) / ED DIAGNOSES  Final diagnoses:  Other migraine without status migrainosus, not intractable      NEW MEDICATIONS STARTED DURING THIS VISIT:  New Prescriptions   No medications on file     Note:  This document was prepared using Dragon voice recognition software and may include unintentional dictation errors.    Lavonia Drafts, MD 10/13/16 [phone]

## 2016-10-13 NOTE — ED Notes (Signed)
Pt continues to rest in bed at this time with the lights off. NAD noted. Pt is noted to be sleepy but awakens with mild stimuli. Pt states pain is "tolerable" at this time. Will continue to monitor for further patient needs.

## 2016-10-13 NOTE — ED Notes (Signed)
Pt's port accessed by this RN and Otila Kluver, RN at bedside. Pt tolerated well. Will continue to monitor for further patient needs.

## 2016-10-13 NOTE — ED Notes (Signed)
Port de-accessed

## 2016-10-13 NOTE — ED Notes (Signed)
Pt resting in bed with lights off, pt's mom at bedside at this time. Pt states pain is unchanged at this time. Will notify MD. Will continue to monitor for further patient needs.

## 2016-10-13 NOTE — ED Notes (Signed)
NAD noted at this time. Pt continues to rest in bed with NAD noted. VSS at this time. Pt's mother remains at bedside

## 2016-12-07 ENCOUNTER — Ambulatory Visit
Admission: EM | Admit: 2016-12-07 | Discharge: 2016-12-07 | Disposition: A | Discharge status: Home / Self Care | Payer: BC Managed Care – PPO | Attending: Family Medicine | Admitting: Family Medicine

## 2016-12-07 ENCOUNTER — Encounter: Payer: Self-pay | Admitting: Gynecology

## 2016-12-07 DIAGNOSIS — H00024 Hordeolum internum left upper eyelid: Secondary | ICD-10-CM

## 2016-12-07 MED ORDER — CEFUROXIME AXETIL 500 MG PO TABS
500.0000 mg | ORAL_TABLET | Freq: Two times a day (BID) | ORAL | 0 refills | Status: DC
Start: 2016-12-07 — End: 2017-02-28

## 2016-12-07 NOTE — ED Triage Notes (Signed)
Per patient c/o left eye pain x 2 days. Per patient no drainage or redness.

## 2016-12-07 NOTE — ED Provider Notes (Signed)
[facility]    CSN: [id number] Arrival date & time: 12/07/16  1752     History   Chief Complaint Chief Complaint  Patient presents with  . Eye Pain    HPI [name] is a 31 y.o. female.   Patient reports painful left eye. She awoke about 2 days ago the morning with eye hurting her this is most the morning. The pain is gotten worse in progress. One time she thought that her son who had a bowel infection and acute INR touch but she no longer thinks that. She states is the left upper eyelid this irritated and inflamed. She started inverted eyelid itself just to see if this thinks she can even open she's been unsuccessful with that. She states the pain is so bad symptoms discomfort she's going to sign a waiver to me pop open the cyst or abscess underneath the left eye even after explained to her the only ophthalmologist for comfortable doing that. She's had a cornea abrasion before but she states this is much worse than that. Despite there's no known object they got into the left eye because of mild discomfort she's asked mouth agreed to go ahead and stainer I inverted eyelid to make sure there is nothing else present in the left eye and under the left eyelid she is allergic to multiple narcotics and states that she refuses to take any type of narcotic for pain for her eye. She does have history of colitis, anxiety bipolar disease and history of multiple concussions. She's had rhinoplasty before in the past she is a gravida 1 para 1. No pertinent family medical history relevant to today's visit. She is a former smoker.   The history is provided by the patient. No language interpreter was used.  Eye Pain  This is a new problem. The current episode started more than 2 days ago. The problem occurs constantly. The problem has been gradually worsening. Pertinent negatives include no chest pain, no abdominal pain, no headaches and no shortness of breath. Nothing aggravates the symptoms.  Nothing relieves the symptoms. She has tried nothing for the symptoms. The treatment provided no relief.    Past Medical History:  Diagnosis Date  . Anxiety   . Bipolar 1 disorder ([place])   . Crohn's colitis ([place])   . Depression   . History of multiple concussions   . Irritable bowel   . Migraines   . Multiple gastric ulcers   . 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 Vertigo     Patient Active Problem List   Diagnosis Date Noted  . Anxiety and depression 08/30/2014  . Poor venous access 06/12/2014  . Bowel disease, inflammatory 06/12/2014    Past Surgical History:  Procedure Laterality Date  . RHINOPLASTY      OB History    Gravida Para Term Preterm AB Living   1 0 0 0 0 1   SAB TAB Ectopic Multiple Live Births   0 0 0 0 1       Home Medications    Prior to Admission medications   Medication Sig Start Date End Date Taking? Authorizing Provider  cyanocobalamin (,VITAMIN B-12,) 1000 MCG/ML injection Inject 1 mL (1,000 mcg total) into the muscle every 30 (thirty) days. 04/17/16  Yes Melody N Shambley, CNM  ondansetron (ZOFRAN-ODT) 4 MG disintegrating tablet Take 4 mg by mouth every 8 (eight) hours as needed.  03/05/16  Yes Historical Provider, MD  PARAGARD INTRAUTERINE COPPER IU by  Intrauterine route.   Yes Historical Provider, MD  phentermine (ADIPEX-P) 37.5 MG tablet Take 1 tablet (37.5 mg total) by mouth daily before breakfast. 07/20/16  Yes Melody N Shambley, CNM  promethazine (PHENERGAN) 25 MG tablet Take 25 mg by mouth every 4 (four) hours as needed.  03/05/16  Yes Historical Provider, MD  Vitamin D, Ergocalciferol, (DRISDOL) 50000 units CAPS capsule Take 1 capsule (50,000 Units total) by mouth every 7 (seven) days. Patient taking differently: Take 50,000 Units by mouth every 7 (seven) days. Sundays or mondays 04/03/16  Yes Melody N Shambley, CNM  benzonatate (TESSALON) 200 MG capsule Take 1 capsule (200 mg total) by mouth 3 (three) times daily as needed for cough.  08/23/16   Frederich Cha, MD  butalbital-acetaminophen-caffeine (FIORICET, [facility]) [id number] MG tablet Take 1-2 tablets by mouth every 6 (six) hours as needed for headache. 10/13/16 10/13/17  Lavonia Drafts, MD  cefUROXime (CEFTIN) 500 MG tablet Take 1 tablet (500 mg total) by mouth 2 (two) times daily. 12/07/16   Frederich Cha, MD    Family History Family History  Problem Relation Age of Onset  . Thyroid disease Mother   . Cancer Father 42    prostate cancer  . Thyroid disease Sister     Social History Social History  Substance Use Topics  . Smoking status: Former Research scientist (life sciences)  . Smokeless tobacco: Never Used  . Alcohol use No     Allergies   Buprenorphine hcl; Morphine; Origanum oil; Demerol [meperidine]; Imitrex [sumatriptan]; Morphine and related; Seroquel  [quetiapine]; Seroquel [quetiapine fumarate]; Sumatriptan succinate; Citalopram; and Other   Review of Systems Review of Systems  Eyes: Positive for photophobia, pain, redness, itching and visual disturbance.  Respiratory: Negative for shortness of breath.   Cardiovascular: Negative for chest pain.  Gastrointestinal: Negative for abdominal pain.  Neurological: Negative for headaches.  All other systems reviewed and are negative.    Physical Exam Triage Vital Signs ED Triage Vitals  Enc Vitals Group     BP 12/07/16 1827 (!) 128/103     Pulse Rate 12/07/16 1827 87     Resp 12/07/16 1827 16     Temp 12/07/16 1827 98.4 F (36.9 C)     Temp Source 12/07/16 1827 Oral     SpO2 12/07/16 1827 100 %     Weight 12/07/16 1831 130 lb (59 kg)     Height 12/07/16 1831 5\' 2"  (1.575 m)     Head Circumference --      Peak Flow --      Pain Score 12/07/16 1834 10     Pain Loc --      Pain Edu? --      Excl. in [place]? --    No data found.   Updated Vital Signs BP (!) 128/103 (BP Location: Left Arm)   Pulse 87   Temp 98.4 F (36.9 C) (Oral)   Resp 16   Ht 5\' 2"  (1.575 m)   Wt 130 lb (59 kg)   LMP 11/27/2016   SpO2 100%   BMI  23.78 kg/m   Visual Acuity Right Eye Distance: 20/24 Left Eye Distance: 20/25 Bilateral Distance: 20/25 (without correctrive lens)  Right Eye Near:   Left Eye Near:    Bilateral Near:     Physical Exam  Constitutional: She is oriented to person, place, and time. She appears well-developed and well-nourished.  Eyes: Pupils are equal, round, and reactive to light. Left eye exhibits discharge and hordeolum. Right conjunctiva has no hemorrhage.  Left conjunctiva is not injected. Left conjunctiva has no hemorrhage.  Underneath the left eyelid is red and inflamed irritated  Neck: Normal range of motion.  Pulmonary/Chest: Effort normal.  Musculoskeletal: Normal range of motion.  Neurological: She is alert and oriented to person, place, and time.  Skin: Skin is warm.  Psychiatric: She has a normal mood and affect.     UC Treatments / Results  Labs (all labs ordered are listed, but only abnormal results are displayed) Labs Reviewed - No data to display  EKG  EKG Interpretation None       Radiology No results found.  Procedures .Foreign Body Removal Date/Time: 12/07/2016 8:07 PM Performed by: Frederich Cha Authorized by: Frederich Cha  Consent: Verbal consent obtained. Body area: eye  Anesthesia: Local Anesthetic: tetracaine drops  Sedation: Patient sedated: no Patient restrained: no Patient cooperative: yes Localization method: eyelid eversion, magnification and visualized Removal mechanism: moist cotton swab Eye examined with fluorescein. 0 objects recovered. Objects recovered: 0 Post-procedure assessment: foreign body not removed Patient tolerance: Patient tolerated the procedure well with no immediate complications Comments: No signs of coronary abrasions are seen eyelid was then flipped and the upper eyelid was inflamed and irritated but no single small foci of abscess was seen. The eyelid was wiped and no foreign objects could be recovered either   (including  critical care time)  Medications Ordered in UC Medications - No data to display   Initial Impression / Assessment and Plan / UC Course  I have reviewed the triage vital signs and the nursing notes.  Pertinent labs & imaging results that were available during my care of the patient were reviewed by me and considered in my medical decision making (see chart for details).     It appears the patient has an infected left upper eyelid. Explained to her this is not much in can do and don't think that this can be a simple excision of a ball or cyst either. At this time I recommend Placing on a systemic antibiotic like Ceftin 500 mg 1 tablet twice a day Patient this is not the usual option but I did give her the respiratory bottle tetracaine eyedrops for this weekend. Explained to her that because it was the weekend and be difficult to get ophthalmologist over the weekend she can use a tetracaine to numb the eye and continue with antibiotics by mouth she is agreed that if she is not better by Monday she will see an ophthalmologist. She also be noted that the vision in the left eye was extubated in the right.  Final Clinical Impressions(s) / UC Diagnoses   Final diagnoses:  Hordeolum internum left upper eyelid    New Prescriptions New Prescriptions   CEFUROXIME (CEFTIN) 500 MG TABLET    Take 1 tablet (500 mg total) by mouth 2 (two) times daily.     Frederich Cha, MD 12/07/16 2014

## 2017-02-05 ENCOUNTER — Ambulatory Visit (INDEPENDENT_AMBULATORY_CARE_PROVIDER_SITE_OTHER): Payer: BC Managed Care – PPO | Admitting: Certified Nurse Midwife

## 2017-02-05 ENCOUNTER — Encounter: Payer: Self-pay | Admitting: Certified Nurse Midwife

## 2017-02-05 VITALS — BP 114/80 | HR 75 | Wt 130.9 lb

## 2017-02-05 DIAGNOSIS — N760 Acute vaginitis: Secondary | ICD-10-CM | POA: Diagnosis not present

## 2017-02-05 DIAGNOSIS — R102 Pelvic and perineal pain: Secondary | ICD-10-CM

## 2017-02-05 DIAGNOSIS — R112 Nausea with vomiting, unspecified: Secondary | ICD-10-CM

## 2017-02-05 DIAGNOSIS — B9689 Other specified bacterial agents as the cause of diseases classified elsewhere: Secondary | ICD-10-CM | POA: Diagnosis not present

## 2017-02-05 LAB — POCT URINE PREGNANCY: PREG TEST UR: NEGATIVE

## 2017-02-05 MED ORDER — METRONIDAZOLE 250 MG PO TABS: 500.0000 mg | ORAL_TABLET | Freq: Two times a day (BID) | ORAL | Status: AC

## 2017-02-05 NOTE — Patient Instructions (Signed)

## 2017-02-05 NOTE — Addendum Note (Signed)
Addended by: Hildred Priest on: 02/05/2017 12:55 PM   Modules accepted: Orders

## 2017-02-05 NOTE — Progress Notes (Signed)
GYNECOLOGY OFFICE PROGRESS NOTE  History:  31 y.o. G1P0001 here today for today for IUD string check.  IUD was placed  A year ago. Recently she has had increased pressure, increased in odor and was not able to feel her strings.  The following portions of the patient's history were reviewed and updated as appropriate: allergies, current medications, past family history, past medical history, past social history, past surgical history and problem list.  Review of Systems:  Pertinent items are noted in HPI.   Objective:  Physical Exam Blood pressure 114/80, pulse 75, weight 130 lb 14.4 oz (59.4 kg), last menstrual period 01/22/2017. CONSTITUTIONAL: Well-developed, well-nourished female in no acute distress.  PELVIC: Normal appearing external genitalia; normal appearing vaginal mucosa and cervix. Odor present.  IUD strings present. They are short and visualized  just outside of the cervix.   Assessment & Plan:  Normal IUD check. Patient to keep IUD in place . Can come in for removal if she desires pregnancy or if recurrent vaginal infections. Wet prep: clue cells and positive whiff test. Flagyl sent to pharmacy.  Nuswab sent Routine preventative health maintenance measures emphasized.   Philip Aspen, CNM

## 2017-02-06 MED ORDER — METRONIDAZOLE 500 MG PO TABS: 500.0000 mg | ORAL_TABLET | Freq: Two times a day (BID) | ORAL | 0 refills | Status: DC

## 2017-02-06 NOTE — Addendum Note (Signed)
Addended by: Earl Lagos on: 02/06/2017 05:04 PM   Modules accepted: Orders

## 2017-02-10 LAB — NUSWAB VAGINITIS PLUS (VG+)
ATOPOBIUM VAGINAE: HIGH {score} — AB
BVAB 2: HIGH Score — AB
Candida albicans, NAA: POSITIVE — AB
Candida glabrata, NAA: NEGATIVE
Chlamydia trachomatis, NAA: NEGATIVE
Megasphaera 1: HIGH Score — AB
Neisseria gonorrhoeae, NAA: NEGATIVE
TRICH VAG BY NAA: NEGATIVE

## 2017-02-11 ENCOUNTER — Telehealth: Payer: Self-pay | Admitting: Certified Nurse Midwife

## 2017-02-11 NOTE — Telephone Encounter (Signed)
Message sent regarding NuSwab results.   Philip Aspen, CNM

## 2017-02-13 ENCOUNTER — Other Ambulatory Visit: Payer: Self-pay | Admitting: Certified Nurse Midwife

## 2017-02-13 MED ORDER — FLUCONAZOLE 150 MG PO TABS: 150.0000 mg | ORAL_TABLET | Freq: Every day | ORAL | 1 refills | Status: DC

## 2017-02-13 NOTE — Progress Notes (Signed)
Diflucan sent to pharmacy for positive result of yeast.   Philip Aspen, CNM

## 2017-02-28 ENCOUNTER — Ambulatory Visit (INDEPENDENT_AMBULATORY_CARE_PROVIDER_SITE_OTHER): Payer: BC Managed Care – PPO | Admitting: Certified Nurse Midwife

## 2017-02-28 ENCOUNTER — Encounter: Payer: Self-pay | Admitting: Certified Nurse Midwife

## 2017-02-28 VITALS — BP 119/92 | HR 76 | Wt 131.6 lb

## 2017-02-28 DIAGNOSIS — Z30432 Encounter for removal of intrauterine contraceptive device: Secondary | ICD-10-CM

## 2017-02-28 DIAGNOSIS — N898 Other specified noninflammatory disorders of vagina: Secondary | ICD-10-CM

## 2017-02-28 NOTE — Progress Notes (Signed)
[name] is a 31 y.o. year old G35P0001 Caucasian female who presents for removal of a Paragard IUD. Her Paragard IUD was placed approximately two (2) years ago.   Patient's last menstrual period was 02/20/2017 (exact date). BP (!) 119/92   Pulse 76   Wt 131 lb 9.6 oz (59.7 kg)   LMP 02/20/2017 (Exact Date)   BMI 24.07 kg/m   Verbal consent for removal of Paragard IUD was obtained. Time out was performed.  A medium plastic speculum was placed in the vagina.  The cervix was visualized, a NuSwab collected, and the strings were visible. They were grasped and the Paragard was easily removed intact without complications.   RTC as needed.   Will contact pt va MyChart with NuSwab results.   Encouraged vaginal health techniques.    Diona Fanti, CNM

## 2017-03-05 ENCOUNTER — Other Ambulatory Visit: Payer: Self-pay | Admitting: Certified Nurse Midwife

## 2017-03-05 ENCOUNTER — Telehealth: Payer: Self-pay | Admitting: Certified Nurse Midwife

## 2017-03-05 ENCOUNTER — Encounter: Payer: Self-pay | Admitting: Obstetrics and Gynecology

## 2017-03-05 MED ORDER — FLUCONAZOLE 150 MG PO TABS: 150.0000 mg | ORAL_TABLET | Freq: Once | ORAL | 1 refills | Status: AC

## 2017-03-05 NOTE — Telephone Encounter (Signed)
Patient would like the script requested earlier sent to [facility] in [place] if its after 5 today when called in

## 2017-03-05 NOTE — Telephone Encounter (Signed)
Rx has been sent to pharmacy on file. Still awaiting results. JML

## 2017-03-05 NOTE — Telephone Encounter (Signed)
Patient called requesting a script for diflucan sent to [facility].

## 2017-03-05 NOTE — Telephone Encounter (Signed)
Pt informed that Diflucan was sent to [facility] in [place] and that we are still waiting on lab results.

## 2017-03-06 ENCOUNTER — Other Ambulatory Visit: Payer: Self-pay | Admitting: Obstetrics and Gynecology

## 2017-03-06 MED ORDER — PHENTERMINE HCL 37.5 MG PO TABS: 37.5000 mg | ORAL_TABLET | Freq: Every day | ORAL | 0 refills | Status: DC

## 2017-03-07 LAB — BACTERIAL VAGINOSIS, NAA

## 2017-03-07 LAB — CANDIDA 6 SPECIES PROFILE, NAA
CANDIDA ALBICANS, NAA: POSITIVE — AB
CANDIDA GLABRATA, NAA: NEGATIVE
CANDIDA KRUSEI, NAA: NEGATIVE
CANDIDA LUSITANIAE, NAA: NEGATIVE
CANDIDA PARAPSILOSIS, NAA: NEGATIVE
CANDIDA TROPICALIS, NAA: NEGATIVE

## 2017-03-11 ENCOUNTER — Other Ambulatory Visit: Payer: Self-pay | Admitting: *Deleted

## 2017-03-11 ENCOUNTER — Encounter: Payer: Self-pay | Admitting: Obstetrics and Gynecology

## 2017-03-11 MED ORDER — TERCONAZOLE 0.4 % VA CREA: 1.0000 | TOPICAL_CREAM | Freq: Every day | VAGINAL | 3 refills | Status: DC

## 2017-03-11 NOTE — Telephone Encounter (Signed)
She may have a Rx for Hylafem for three (3) days, Terazole for seven (7) days or Boric acid tablets for 10 days. The choice is hers. JML

## 2017-04-11 ENCOUNTER — Other Ambulatory Visit: Payer: Self-pay | Admitting: Obstetrics and Gynecology

## 2017-07-09 ENCOUNTER — Encounter: Payer: BC Managed Care – PPO | Admitting: Obstetrics and Gynecology

## 2018-01-16 ENCOUNTER — Encounter: Payer: Self-pay | Admitting: Obstetrics and Gynecology

## 2018-01-16 ENCOUNTER — Ambulatory Visit (INDEPENDENT_AMBULATORY_CARE_PROVIDER_SITE_OTHER): Payer: BLUE CROSS/BLUE SHIELD | Admitting: Obstetrics and Gynecology

## 2018-01-16 VITALS — BP 139/84 | HR 71 | Ht 62.0 in | Wt 148.5 lb

## 2018-01-16 DIAGNOSIS — E663 Overweight: Secondary | ICD-10-CM

## 2018-01-16 DIAGNOSIS — F419 Anxiety disorder, unspecified: Secondary | ICD-10-CM | POA: Diagnosis not present

## 2018-01-16 MED ORDER — 5-HTP 50 MG PO CAPS: 1.0000 | ORAL_CAPSULE | Freq: Every day | ORAL | 2 refills | Status: DC

## 2018-01-16 MED ORDER — CYANOCOBALAMIN 1000 MCG/ML IJ SOLN: 1000.0000 ug | INTRAMUSCULAR | 1 refills | Status: DC

## 2018-01-16 NOTE — Progress Notes (Signed)
Subjective:     Patient ID: [name], female   DOB: 1986-05-21, 32 y.o.   MRN: [id number]  HPI Here to discuss weight loss and anxiety. Stopped smoking 8 months. Has gained weight since then. Also feels better since stopping BC. Did well with weight loss program in past. Taking OTC [place] wart and B12 and HTP with no changes.   Reports increased anxiety and agitation. Not working out as has a toddler at home and switched jobs. Mood swings worse premenstrually.   Review of Systems Negative except stated above in HPI    Objective:   Physical Exam A&Ox4 Well groomed female in no distress Blood pressure 139/84, pulse 71, height 5\' 2"  (1.575 m), weight 148 lb 8 oz (67.4 kg), last menstrual period 12/23/2017. Body mass index is 27.16 kg/m.    Assessment:     Overweight Anxiety     Plan:     Will restart weight loss medication and restart exercise. Declined anxiety medications at this time.  RTC in 4 weeks for wt/bp/b12     Melody Shambley,CNM

## 2018-02-10 ENCOUNTER — Emergency Department
Admission: EM | Admit: 2018-02-10 | Discharge: 2018-02-10 | Disposition: A | Discharge status: Home / Self Care | Payer: BLUE CROSS/BLUE SHIELD | Attending: Emergency Medicine | Admitting: Emergency Medicine

## 2018-02-10 ENCOUNTER — Other Ambulatory Visit: Payer: Self-pay

## 2018-02-10 ENCOUNTER — Encounter: Payer: Self-pay | Admitting: Emergency Medicine

## 2018-02-10 ENCOUNTER — Ambulatory Visit: Payer: BLUE CROSS/BLUE SHIELD | Admitting: Obstetrics and Gynecology

## 2018-02-10 DIAGNOSIS — Z87891 Personal history of nicotine dependence: Secondary | ICD-10-CM | POA: Insufficient documentation

## 2018-02-10 DIAGNOSIS — Z79899 Other long term (current) drug therapy: Secondary | ICD-10-CM | POA: Diagnosis not present

## 2018-02-10 DIAGNOSIS — J029 Acute pharyngitis, unspecified: Secondary | ICD-10-CM | POA: Diagnosis not present

## 2018-02-10 DIAGNOSIS — R070 Pain in throat: Secondary | ICD-10-CM | POA: Diagnosis present

## 2018-02-10 LAB — GROUP A STREP BY PCR: GROUP A STREP BY PCR: NOT DETECTED

## 2018-02-10 NOTE — ED Notes (Signed)
Patient given a mask to wear.

## 2018-02-10 NOTE — ED Triage Notes (Signed)
Here with son and wants to be seen for sore throat.  Sore throat X 5 days on both sides.  HX of tosillectomy.  Also complaining of cough and HA.  Taking OTC meds.

## 2018-02-10 NOTE — Discharge Instructions (Signed)
Follow-up with your primary care doctor or OB/GYN if any continued problems.  You may take Tylenol as needed for throat pain.  Increase fluids.  Today your strep test was negative.

## 2018-02-10 NOTE — ED Notes (Signed)
See triage note  States she developed a sore throat last weds.  No fever also has had cough with slight headache

## 2018-02-10 NOTE — ED Provider Notes (Signed)
[facility] Emergency Department Provider Note  ____________________________________________   First MD Initiated Contact with Patient 02/10/18 [phone]     (approximate)  I have reviewed the triage vital signs and the nursing notes.   HISTORY  Chief Complaint Sore Throat   HPI [name] is a 32 y.o. female is here with complaint of sore throat for 5 days.  Patient states that she is also had some coughing and a mild headache.  She states that her son is being seen for sore throat so she is being seen as well.  Patient currently is taking over-the-counter medication as recommended by her doctor.  She is unaware of any fever and denies chills.  She rates her pain as 7 out of 10.    Past Medical History:  Diagnosis Date  . Anxiety   . Bipolar 1 disorder ([place])   . Crohn's colitis ([place])   . Depression   . History of multiple concussions   . Irritable bowel   . Migraines   . Multiple gastric ulcers   . 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 Vertigo     Patient Active Problem List   Diagnosis Date Noted  . Bacterial vaginal infection 02/05/2017  . Anxiety and depression 08/30/2014  . Poor venous access 06/12/2014  . Bowel disease, inflammatory 06/12/2014    Past Surgical History:  Procedure Laterality Date  . RHINOPLASTY      Prior to Admission medications   Medication Sig Start Date End Date Taking? Authorizing Provider  5-Hydroxytryptophan (5-HTP) 50 MG CAPS Take 1 capsule by mouth daily. 01/16/18   Shambley, Melody N, CNM  cyanocobalamin (,VITAMIN B-12,) 1000 MCG/ML injection Inject 1 mL (1,000 mcg total) into the muscle every 30 (thirty) days. 01/16/18   Shambley, Melody N, CNM  vitamin B-12 (CYANOCOBALAMIN) 1000 MCG tablet Take 1,000 mcg by mouth daily.    [provider]    Allergies Buprenorphine hcl; Morphine; Origanum oil; Demerol [meperidine]; Imitrex [sumatriptan]; Morphine and related; Seroquel  [quetiapine]; Seroquel  [quetiapine fumarate]; Sumatriptan succinate; Citalopram; and Other  Family History  Problem Relation Age of Onset  . Thyroid disease Mother   . Cancer Father 20       prostate cancer  . Thyroid disease Sister     Social History Social History   Tobacco Use  . Smoking status: Former Research scientist (life sciences)  . Smokeless tobacco: Never Used  Substance Use Topics  . Alcohol use: No  . Drug use: No    Review of Systems Constitutional: No fever/chills Eyes: No visual changes. ENT: Positive sore throat.  Negative for ear pain. Cardiovascular: Denies chest pain. Respiratory: Denies shortness of breath. Gastrointestinal: No abdominal pain.  No nausea, no vomiting.   Musculoskeletal: Negative for back pain. Skin: Negative for rash. Neurological: Negative for headaches, focal weakness or numbness. ____________________________________________   PHYSICAL EXAM:  VITAL SIGNS: ED Triage Vitals  Enc Vitals Group     BP 02/10/18 0846 126/87     Pulse Rate 02/10/18 0846 82     Resp 02/10/18 0846 16     Temp 02/10/18 0846 97.9 F (36.6 C)     Temp Source 02/10/18 0846 Oral     SpO2 02/10/18 0846 98 %     Weight 02/10/18 0847 140 lb (63.5 kg)     Height 02/10/18 0847 5\' 2"  (1.575 m)     Head Circumference --      Peak Flow --      Pain Score  02/10/18 0846 7     Pain Loc --      Pain Edu? --      Excl. in [place]? --    Constitutional: Alert and oriented. Well appearing and in no acute distress. Eyes: Conjunctivae are normal.  Head: Atraumatic. Nose: Minimal congestion/rhinnorhea.  TMs are dull but no erythema or injection was noted. Mouth/Throat: Mucous membranes are moist.  Oropharynx non-erythematous.  Moderate posterior drainage and cobblestoning is noted.  No exudate is present. Neck: No stridor.   Hematological/Lymphatic/Immunilogical: No cervical lymphadenopathy. Cardiovascular: Normal rate, regular rhythm. Grossly normal heart sounds.  Good peripheral circulation. Respiratory: Normal  respiratory effort.  No retractions. Lungs CTAB. Musculoskeletal: Moves upper and lower extremities without any difficulty normal gait was noted. Neurologic:  Normal speech and language. No gross focal neurologic deficits are appreciated.  Skin:  Skin is warm, dry and intact. No rash noted. Psychiatric: Mood and affect are normal. Speech and behavior are normal.  ____________________________________________   LABS (all labs ordered are listed, but only abnormal results are displayed)  Labs Reviewed  GROUP A STREP BY PCR    PROCEDURES  Procedure(s) performed: None  Procedures  Critical Care performed: No  ____________________________________________   INITIAL IMPRESSION / ASSESSMENT AND PLAN / ED COURSE Patient was made aware that strep test was negative as well as her sons who is also here to be evaluated.  She will continue with clear liquids and over-the-counter medication as directed by her doctor.  ____________________________________________   FINAL CLINICAL IMPRESSION(S) / ED DIAGNOSES  Final diagnoses:  Viral pharyngitis     ED Discharge Orders    None       Note:  This document was prepared using Dragon voice recognition software and may include unintentional dictation errors.    Johnn Hai, PA-C 02/10/18 1045    Schuyler Amor, MD 02/10/18 [phone]

## 2018-02-17 ENCOUNTER — Ambulatory Visit: Payer: BLUE CROSS/BLUE SHIELD | Admitting: Obstetrics and Gynecology

## 2018-02-24 ENCOUNTER — Encounter: Payer: Self-pay | Admitting: Obstetrics and Gynecology

## 2018-02-24 ENCOUNTER — Ambulatory Visit (INDEPENDENT_AMBULATORY_CARE_PROVIDER_SITE_OTHER): Payer: BLUE CROSS/BLUE SHIELD | Admitting: Obstetrics and Gynecology

## 2018-02-24 VITALS — BP 125/84 | HR 77 | Wt 146.4 lb

## 2018-02-24 DIAGNOSIS — E663 Overweight: Secondary | ICD-10-CM | POA: Diagnosis not present

## 2018-02-24 MED ORDER — CYANOCOBALAMIN 1000 MCG/ML IJ SOLN
1000.0000 ug | Freq: Once | INTRAMUSCULAR | Status: AC
  Administered 2018-02-24: 1000 ug via INTRAMUSCULAR

## 2018-02-24 NOTE — Progress Notes (Signed)
Pt is here for wt, bp check, b-12 ing She is doing well  02/24/18 wt- 148.4lb 01/16/18 wt- 146.4lb

## 2018-02-26 ENCOUNTER — Other Ambulatory Visit: Payer: Self-pay | Admitting: Obstetrics and Gynecology

## 2018-02-26 DIAGNOSIS — E663 Overweight: Secondary | ICD-10-CM

## 2018-02-27 ENCOUNTER — Telehealth: Payer: Self-pay | Admitting: Obstetrics and Gynecology

## 2018-02-27 NOTE — Telephone Encounter (Signed)
Done-ac 

## 2018-02-27 NOTE — Telephone Encounter (Signed)
The patient called and stated that she needs her medication sent into her pharmacy, The patient would also like to speak with Amy in regards to that as well. Please advise.

## 2018-03-19 ENCOUNTER — Encounter: Payer: Self-pay | Admitting: Emergency Medicine

## 2018-03-19 ENCOUNTER — Emergency Department: Payer: BLUE CROSS/BLUE SHIELD

## 2018-03-19 ENCOUNTER — Emergency Department
Admission: EM | Admit: 2018-03-19 | Discharge: 2018-03-19 | Disposition: A | Discharge status: Home / Self Care | Payer: BLUE CROSS/BLUE SHIELD | Attending: Emergency Medicine | Admitting: Emergency Medicine

## 2018-03-19 ENCOUNTER — Ambulatory Visit
Admission: EM | Admit: 2018-03-19 | Discharge: 2018-03-19 | Disposition: A | Discharge status: Other Acute Inpatient Hospital | Payer: BLUE CROSS/BLUE SHIELD | Source: Home / Self Care | Attending: Family Medicine | Admitting: Family Medicine

## 2018-03-19 ENCOUNTER — Other Ambulatory Visit: Payer: Self-pay

## 2018-03-19 DIAGNOSIS — R1031 Right lower quadrant pain: Secondary | ICD-10-CM

## 2018-03-19 DIAGNOSIS — K2900 Acute gastritis without bleeding: Secondary | ICD-10-CM

## 2018-03-19 DIAGNOSIS — R112 Nausea with vomiting, unspecified: Secondary | ICD-10-CM

## 2018-03-19 DIAGNOSIS — Z79899 Other long term (current) drug therapy: Secondary | ICD-10-CM | POA: Insufficient documentation

## 2018-03-19 DIAGNOSIS — Z87891 Personal history of nicotine dependence: Secondary | ICD-10-CM | POA: Insufficient documentation

## 2018-03-19 DIAGNOSIS — R109 Unspecified abdominal pain: Secondary | ICD-10-CM | POA: Diagnosis present

## 2018-03-19 LAB — COMPREHENSIVE METABOLIC PANEL WITH GFR
ALT: 12 U/L — ABNORMAL LOW (ref 14–54)
AST: 16 U/L (ref 15–41)
Albumin: 4.4 g/dL (ref 3.5–5.0)
Alkaline Phosphatase: 62 U/L (ref 38–126)
Anion gap: 4 — ABNORMAL LOW (ref 5–15)
BUN: 11 mg/dL (ref 6–20)
CO2: 26 mmol/L (ref 22–32)
Calcium: 9 mg/dL (ref 8.9–10.3)
Chloride: 108 mmol/L (ref 101–111)
Creatinine, Ser: 0.61 mg/dL (ref 0.44–1.00)
GFR calc Af Amer: >60 mL/min
GFR calc non Af Amer: >60 mL/min
Glucose, Bld: 92 mg/dL (ref 65–99)
Potassium: 5 mmol/L (ref 3.5–5.1)
Sodium: 138 mmol/L (ref 135–145)
Total Bilirubin: 0.5 mg/dL (ref 0.3–1.2)
Total Protein: 7 g/dL (ref 6.5–8.1)

## 2018-03-19 LAB — URINALYSIS, COMPLETE (UACMP) WITH MICROSCOPIC
BACTERIA UA: NONE SEEN
BILIRUBIN URINE: NEGATIVE
Glucose, UA: NEGATIVE mg/dL
Hgb urine dipstick: NEGATIVE
KETONES UR: NEGATIVE mg/dL
LEUKOCYTES UA: NEGATIVE
Nitrite: NEGATIVE
Protein, ur: NEGATIVE mg/dL
SPECIFIC GRAVITY, URINE: 1.017 (ref 1.005–1.030)
pH: 5 (ref 5.0–8.0)

## 2018-03-19 LAB — CBC
HCT: 44.7 % (ref 35.0–47.0)
Hemoglobin: 14.8 g/dL (ref 12.0–16.0)
MCH: 30.3 pg (ref 26.0–34.0)
MCHC: 33 g/dL (ref 32.0–36.0)
MCV: 91.7 fL (ref 80.0–100.0)
Platelets: 184 K/uL (ref 150–440)
RBC: 4.88 MIL/uL (ref 3.80–5.20)
RDW: 13.5 % (ref 11.5–14.5)
WBC: 6.9 K/uL (ref 3.6–11.0)

## 2018-03-19 LAB — LIPASE, BLOOD: LIPASE: 44 U/L (ref 11–51)

## 2018-03-19 LAB — POCT PREGNANCY, URINE: Preg Test, Ur: NEGATIVE

## 2018-03-19 MED ORDER — SODIUM CHLORIDE 0.9 % IV BOLUS
1000.0000 mL | Freq: Once | INTRAVENOUS | Status: AC
  Administered 2018-03-19: 1000 mL via INTRAVENOUS

## 2018-03-19 MED ORDER — ONDANSETRON 4 MG PO TBDP
4.0000 mg | ORAL_TABLET | Freq: Once | ORAL | Status: AC
  Administered 2018-03-19: 4 mg via ORAL

## 2018-03-19 MED ORDER — PANTOPRAZOLE SODIUM 40 MG PO TBEC: 40.0000 mg | DELAYED_RELEASE_TABLET | Freq: Every day | ORAL | 1 refills | Status: AC

## 2018-03-19 MED ORDER — HYDROMORPHONE HCL 1 MG/ML IJ SOLN
0.5000 mg | Freq: Once | INTRAMUSCULAR | Status: AC
  Administered 2018-03-19: 0.5 mg via INTRAVENOUS
  Filled 2018-03-19: qty 1

## 2018-03-19 MED ORDER — PROCHLORPERAZINE MALEATE 10 MG PO TABS: 10.0000 mg | ORAL_TABLET | Freq: Four times a day (QID) | ORAL | 0 refills | Status: DC | PRN

## 2018-03-19 MED ORDER — PROCHLORPERAZINE EDISYLATE 10 MG/2ML IJ SOLN
10.0000 mg | Freq: Once | INTRAMUSCULAR | Status: AC
  Administered 2018-03-19: 10 mg via INTRAVENOUS
  Filled 2018-03-19: qty 2

## 2018-03-19 MED ORDER — IOPAMIDOL (ISOVUE-300) INJECTION 61%
100.0000 mL | Freq: Once | INTRAVENOUS | Status: AC | PRN
  Administered 2018-03-19: 100 mL via INTRAVENOUS

## 2018-03-19 MED ORDER — FAMOTIDINE IN NACL 20-0.9 MG/50ML-% IV SOLN
20.0000 mg | Freq: Once | INTRAVENOUS | Status: AC
  Administered 2018-03-19: 20 mg via INTRAVENOUS
  Filled 2018-03-19: qty 50

## 2018-03-19 NOTE — ED Triage Notes (Addendum)
Patient in today c/o 2 day history of nausea and emesis. Patient's last episode of emesis was last night, nausea continues. Patient has been using Phenergan and Zofran for the nausea. Patient's last dose of Zofran 8mg  ODT was ~7:45 am this morning.

## 2018-03-19 NOTE — ED Provider Notes (Signed)
[facility] ____________________________________________  Time seen: Approximately 10:36 AM  I have reviewed the triage vital signs and the nursing notes.   HISTORY  Chief Complaint Emesis  HPI [name] is a 32 y.o. female presenting for evaluation of 2 days of nausea with intermittent vomiting.  Patient reports she has a history of ulcerative colitis, multiple gastric ulcers, Crohn's disease and irritable bowel, but denies any abdominal surgeries associated with this other than bleeding polyp.  Patient reports that usually she is able to control her GI medical history with diet, denies any diet changes.  States Monday afternoon she began with increased nausea, generalized abdominal discomfort which has continued.  Patient states that this time she has been taking Zofran and Phenergan frequently which does help control vomiting and has only vomited twice, but continued nausea.  At this time reports right lower quadrant abdominal pain.  Normal bowel movements, last bowel movement was Monday.  No known fevers.  Denies food triggers.  Denies trauma.  Reports otherwise feels well.  Denies chest pain, shortness of breath, dysuria, or rash. Denies recent sickness. Denies recent antibiotic use.   Physicians, [employer]: PCP    Past Medical History:  Diagnosis Date  . Anxiety   . Bipolar 1 disorder ([place])   . Crohn's colitis ([place])   . Depression   . History of multiple concussions   . Irritable bowel   . Migraines   . Multiple gastric ulcers   . Ulcerative (chronic) enterocolitis ([place])   . Vertigo     Patient Active Problem List   Diagnosis Date Noted  . Bacterial vaginal infection 02/05/2017  . Anxiety and depression 08/30/2014  . Poor venous access 06/12/2014  . Bowel disease, inflammatory 06/12/2014    Past Surgical History:  Procedure Laterality Date  . RHINOPLASTY       No current facility-administered medications for this encounter.   Current  Outpatient Medications:  .  phentermine (ADIPEX-P) 37.5 MG tablet, TAKE 1 TABLET BY MOUTH IN THE MORNING, Disp: 30 tablet, Rfl: 2 .  cyanocobalamin (,VITAMIN B-12,) 1000 MCG/ML injection, Inject 1 mL (1,000 mcg total) into the muscle every 30 (thirty) days., Disp: 10 mL, Rfl: 1  Allergies Buprenorphine hcl; Morphine; Origanum oil; Demerol [meperidine]; Imitrex [sumatriptan]; Morphine and related; Seroquel  [quetiapine]; Seroquel [quetiapine fumarate]; Sumatriptan succinate; Citalopram; and Other  Family History  Problem Relation Age of Onset  . Thyroid disease Mother   . Cancer Father 41       prostate cancer  . Thyroid disease Sister     Social History Social History   Tobacco Use  . Smoking status: Former Smoker    Last attempt to quit: 03/19/2017    Years since quitting: 1.0  . Smokeless tobacco: Never Used  Substance Use Topics  . Alcohol use: No  . Drug use: No    Review of Systems Constitutional: No fever  Cardiovascular: Denies chest pain. Respiratory: Denies shortness of breath. Gastrointestinal: as above.  Genitourinary: Negative for dysuria. Musculoskeletal: Negative for back pain. Skin: Negative for rash.   ____________________________________________   PHYSICAL EXAM:  VITAL SIGNS: ED Triage Vitals  Enc Vitals Group     BP 03/19/18 0843 128/83     Pulse Rate 03/19/18 0843 92     Resp 03/19/18 0843 16     Temp 03/19/18 0843 98 F (36.7 C)     Temp Source 03/19/18 0843 Oral     SpO2 03/19/18 0843 100 %  Weight 03/19/18 0844 140 lb (63.5 kg)     Height 03/19/18 0844 5\' 2"  (1.575 m)     Head Circumference --      Peak Flow --      Pain Score 03/19/18 0844 4     Pain Loc --      Pain Edu? --      Excl. in [place]? --     Constitutional: Alert and oriented. Well appearing and in no acute distress. ENT      Head: Normocephalic and atraumatic.      Mouth/Throat: Mucous membranes are moist.Oropharynx non-erythematous. Cardiovascular: Normal rate,  regular rhythm. Grossly normal heart sounds.  Good peripheral circulation. Respiratory: Normal respiratory effort without tachypnea nor retractions. Breath sounds are clear and equal bilaterally. No wheezes, rales, rhonchi. Gastrointestinal: Normal Bowel sounds.  Abdomen mild diffuse tenderness, moderate right lower quadrant tenderness at McBurney's point.  Positive obturator sign.  No CVA tenderness. Musculoskeletal:  No midline cervical, thoracic or lumbar tenderness to palpation.  Neurologic:  Normal speech and language. Speech is normal. No gait instability.  Skin:  Skin is warm, dry and intact. No rash noted. Psychiatric: Mood and affect are normal. Speech and behavior are normal. Patient exhibits appropriate insight and judgment   ___________________________________________   LABS (all labs ordered are listed, but only abnormal results are displayed)  Labs Reviewed - No data to display ____________________________________________   PROCEDURES Procedures    INITIAL IMPRESSION / ASSESSMENT AND PLAN / ED COURSE  Pertinent labs & imaging results that were available during my care of the patient were reviewed by me and considered in my medical decision making (see chart for details).  Past medical history significant for ulcerative colitis, Crohn's disease and irritable bowel presenting for nausea with intermittent vomiting with his history of abdominal pain without food trigger.  Denies known sick contacts.  Patient reports this is a typical for her.  Does report some similar previous history with inflamed ileum second to ulcerative colitis as well as concern for appendicitis.  Patient appears stable, however patient states concern is pain increasing in abdomen.  4 mg ODT Zofran given in urgent care.  Discussed treatment options, patient states that she will proceed to emergency room for further evaluation at this time.  Patient states that she will drive herself.  Can RN triage nurse  called and given report at [facility].    ____________________________________________   FINAL CLINICAL IMPRESSION(S) / ED DIAGNOSES  Final diagnoses:  Right lower quadrant abdominal pain  Nausea and vomiting, intractability of vomiting not specified, unspecified vomiting type     ED Discharge Orders    None       Note: This dictation was prepared with Dragon dictation along with smaller phrase technology. Any transcriptional errors that result from this process are unintentional.         Marylene Land, NP 03/19/18 1042

## 2018-03-19 NOTE — ED Triage Notes (Signed)
abd pain vomiting since Monday. Says she went to [facility] and they sent here here because she is tender on right abd.

## 2018-03-19 NOTE — ED Notes (Signed)
Patient transported to CT 

## 2018-03-19 NOTE — ED Provider Notes (Signed)
[facility] Emergency Department Provider Note  ____________________________________________  Time seen: Approximately 1:35 PM  I have reviewed the triage vital signs and the nursing notes.   HISTORY  Chief Complaint Abdominal Pain and Emesis   HPI [name] is a 32 y.o. female with a history of Crohn's disease and bipolar disorder who presents for evaluation of abdominal pain nausea vomiting.  Her symptoms started yesterday.  Patient is complaining of moderate sharp diffuse abdominal pain which is now mild.  She also has had severe constant nausea and several episodes of nonbloody nonbilious emesis.  She has been unable to tolerate p.o.  She denies fever or chills.  She denies vaginal discharge, dysuria, hematuria, constipation, diarrhea.  She had normal bowel movement today.  She is complaining of abdominal distention.  She has tried Phenergan and Zofran at home with no relief.  Past Medical History:  Diagnosis Date  . Anxiety   . Bipolar 1 disorder ([place])   . Crohn's colitis ([place])   . Depression   . History of multiple concussions   . Irritable bowel   . Migraines   . Multiple gastric ulcers   . Ulcerative (chronic) enterocolitis ([place])   . Vertigo     Patient Active Problem List   Diagnosis Date Noted  . Bacterial vaginal infection 02/05/2017  . Anxiety and depression 08/30/2014  . Poor venous access 06/12/2014  . Bowel disease, inflammatory 06/12/2014    Past Surgical History:  Procedure Laterality Date  . RHINOPLASTY      Prior to Admission medications   Medication Sig Start Date End Date Taking? Authorizing Provider  cyanocobalamin (,VITAMIN B-12,) 1000 MCG/ML injection Inject 1 mL (1,000 mcg total) into the muscle every 30 (thirty) days. 01/16/18   Shambley, Melody N, CNM  pantoprazole (PROTONIX) 40 MG tablet Take 1 tablet (40 mg total) by mouth daily. 03/19/18 03/19/19  Rudene Re, MD  phentermine (ADIPEX-P) 37.5 MG tablet TAKE  1 TABLET BY MOUTH IN THE MORNING 02/27/18   Shambley, Melody N, CNM  prochlorperazine (COMPAZINE) 10 MG tablet Take 1 tablet (10 mg total) by mouth every 6 (six) hours as needed for nausea or vomiting. 03/19/18   Rudene Re, MD    Allergies Buprenorphine hcl; Morphine; Origanum oil; Demerol [meperidine]; Imitrex [sumatriptan]; Morphine and related; Seroquel  [quetiapine]; Seroquel [quetiapine fumarate]; Sumatriptan succinate; Citalopram; and Other  Family History  Problem Relation Age of Onset  . Thyroid disease Mother   . Cancer Father 48       prostate cancer  . Thyroid disease Sister     Social History Social History   Tobacco Use  . Smoking status: Former Smoker    Last attempt to quit: 03/19/2017    Years since quitting: 1.0  . Smokeless tobacco: Never Used  Substance Use Topics  . Alcohol use: No  . Drug use: No    Review of Systems  Constitutional: Negative for fever. Eyes: Negative for visual changes. ENT: Negative for sore throat. Neck: No neck pain  Cardiovascular: Negative for chest pain. Respiratory: Negative for shortness of breath. Gastrointestinal: + abdominal pain, nausea, and vomiting. No diarrhea. Genitourinary: Negative for dysuria. Musculoskeletal: Negative for back pain. Skin: Negative for rash. Neurological: Negative for headaches, weakness or numbness. Psych: No SI or HI  ____________________________________________   PHYSICAL EXAM:  VITAL SIGNS: ED Triage Vitals [03/19/18 0949]  Enc Vitals Group     BP (!) 130/101     Pulse Rate 88  Resp 16     Temp 98.1 F (36.7 C)     Temp Source Oral     SpO2 100 %     Weight 140 lb (63.5 kg)     Height 5\' 2"  (1.575 m)     Head Circumference      Peak Flow      Pain Score 5     Pain Loc      Pain Edu?      Excl. in [place]?     Constitutional: Alert and oriented. Well appearing and in no apparent distress. HEENT:      Head: Normocephalic and atraumatic.         Eyes: Conjunctivae are  normal. Sclera is non-icteric.       Mouth/Throat: Mucous membranes are dry.       Neck: Supple with no signs of meningismus. Cardiovascular: Regular rate and rhythm. No murmurs, gallops, or rubs. 2+ symmetrical distal pulses are present in all extremities. No JVD. Respiratory: Normal respiratory effort. Lungs are clear to auscultation bilaterally. No wheezes, crackles, or rhonchi.  Gastrointestinal: Soft, diffuse tenderness palpation, and non distended with positive bowel sounds. No rebound or guarding. Musculoskeletal: Nontender with normal range of motion in all extremities. No edema, cyanosis, or erythema of extremities. Neurologic: Normal speech and language. Face is symmetric. Moving all extremities. No gross focal neurologic deficits are appreciated. Skin: Skin is warm, dry and intact. No rash noted. Psychiatric: Mood and affect are normal. Speech and behavior are normal.  ____________________________________________   LABS (all labs ordered are listed, but only abnormal results are displayed)  Labs Reviewed  COMPREHENSIVE METABOLIC PANEL - Abnormal; Notable for the following components:      Result Value   ALT 12 (*)    Anion gap 4 (*)    All other components within normal limits  URINALYSIS, COMPLETE (UACMP) WITH MICROSCOPIC - Abnormal; Notable for the following components:   Color, Urine YELLOW (*)    APPearance CLEAR (*)    All other components within normal limits  LIPASE, BLOOD  CBC  POC URINE PREG, ED  POCT PREGNANCY, URINE   ____________________________________________  EKG  none  ____________________________________________  RADIOLOGY  I have personally reviewed the images performed during this visit and I agree with the Radiologist's read.   Interpretation by Radiologist:  Ct Abdomen Pelvis W Contrast  Result Date: 03/19/2018 CLINICAL DATA:  Nausea vomiting for 2 days EXAM: CT ABDOMEN AND PELVIS WITH CONTRAST TECHNIQUE: Multidetector CT imaging of the  abdomen and pelvis was performed using the standard protocol following bolus administration of intravenous contrast. CONTRAST:  181mL ISOVUE-300 IOPAMIDOL (ISOVUE-300) INJECTION 61% COMPARISON:  None. FINDINGS: Lower chest: Lung bases are free of acute infiltrate or sizable effusion. Hepatobiliary: No focal liver abnormality is seen. No gallstones, gallbladder wall thickening, or biliary dilatation. Pancreas: Unremarkable. No pancreatic ductal dilatation or surrounding inflammatory changes. Spleen: Normal in size without focal abnormality. Adrenals/Urinary Tract: Adrenal glands are unremarkable. Kidneys are normal, without renal calculi, focal lesion, or hydronephrosis. Bladder is unremarkable. Stomach/Bowel: The appendix is within normal limits. No obstructive or inflammatory changes of the larger small-bowel are seen. Stomach is well distended. Some very mild wall edema is noted in the distal antrum which could represent some mild gastritis. No perforation is identified. Vascular/Lymphatic: No significant vascular findings are present. No enlarged abdominal or pelvic lymph nodes. Reproductive: Uterus and bilateral adnexa are unremarkable. Other: No abdominal wall hernia or abnormality. No abdominopelvic ascites. Musculoskeletal: No acute or significant  osseous findings. IMPRESSION: Mild wall thickening in a circumferential fashion in the distal gastric antrum which may represent some focal gastritis. No other focal abnormality is seen. Electronically Signed   By: Inez Catalina M.D.   On: 03/19/2018 13:40      ____________________________________________   PROCEDURES  Procedure(s) performed: None Procedures Critical Care performed:  None ____________________________________________   INITIAL IMPRESSION / ASSESSMENT AND PLAN / ED COURSE  32 y.o. female with a history of Crohn's disease and bipolar disorder who presents for evaluation of abdominal pain, nausea, and vomiting since yesterday.  Patient  looks dry on exam, she is afebrile, hemodynamically stable, abdomen is soft with diffuse tenderness to palpation, no rebound or guarding, no distention.  Differential diagnosis is broad and includes appendicitis, diverticulitis, colitis, SBO, gallbladder pathology, pyelonephritis, UTI, pregnancy, ectopic.  Will give IV fluids, Dilaudid, Compazine and send patient for CT abdomen pelvis.  Labs are pending    _________________________ 2:50 PM on 03/19/2018 -----------------------------------------  Labs and urine with no acute findings.  Pregnancy is negative.  CT concerning for gastritis.  After liter of fluids and IV Zofran patient feels markedly improved.  Will give IV Pepcid and p.o. challenge.  Will discharge home on Protonix and follow-up with her GI doctor.   As part of my medical decision making, I reviewed the following data within the Minor notes reviewed and incorporated, Labs reviewed , Old chart reviewed, Radiograph reviewed , Notes from prior ED visits and [place] Controlled Substance Database    Pertinent labs & imaging results that were available during my care of the patient were reviewed by me and considered in my medical decision making (see chart for details).    ____________________________________________   FINAL CLINICAL IMPRESSION(S) / ED DIAGNOSES  Final diagnoses:  Acute gastritis without hemorrhage, unspecified gastritis type      NEW MEDICATIONS STARTED DURING THIS VISIT:  ED Discharge Orders        Ordered    pantoprazole (PROTONIX) 40 MG tablet  Daily     03/19/18 1450    prochlorperazine (COMPAZINE) 10 MG tablet  Every 6 hours PRN     03/19/18 1450       Note:  This document was prepared using Dragon voice recognition software and may include unintentional dictation errors.    Alfred Levins, [place], MD 03/19/18 [phone]

## 2018-03-19 NOTE — ED Notes (Signed)
Ginger ale and crackers provided to pt.

## 2018-03-19 NOTE — ED Notes (Signed)
ED Provider at bedside. 

## 2018-03-19 NOTE — Discharge Instructions (Addendum)
Go directly to Emergency room as discussed.  °

## 2018-03-28 ENCOUNTER — Ambulatory Visit (INDEPENDENT_AMBULATORY_CARE_PROVIDER_SITE_OTHER): Payer: BLUE CROSS/BLUE SHIELD | Admitting: Obstetrics and Gynecology

## 2018-03-28 VITALS — BP 114/81 | HR 108 | Ht 62.0 in | Wt 141.5 lb

## 2018-03-28 DIAGNOSIS — E663 Overweight: Secondary | ICD-10-CM

## 2018-03-28 MED ORDER — CYANOCOBALAMIN 1000 MCG/ML IJ SOLN
1000.0000 ug | Freq: Once | INTRAMUSCULAR | Status: AC
  Administered 2018-03-28: 1000 ug via INTRAMUSCULAR

## 2018-03-28 NOTE — Progress Notes (Signed)
Pt is present today for b12 injection, wt and bp. Pt stated no side effects to medication.      BP 114/81   Pulse (!) 108   Ht 5\' 2"  (1.575 m)   Wt 141 lb 8 oz (64.2 kg)   LMP 02/22/2018   BMI 25.88 kg/m

## 2018-04-10 ENCOUNTER — Encounter: Payer: BLUE CROSS/BLUE SHIELD | Admitting: Certified Nurse Midwife

## 2019-05-25 ENCOUNTER — Other Ambulatory Visit: Payer: Self-pay

## 2019-05-25 DIAGNOSIS — Z20822 Contact with and (suspected) exposure to covid-19: Secondary | ICD-10-CM

## 2019-05-25 NOTE — Progress Notes (Unsigned)
la 

## 2019-05-27 LAB — NOVEL CORONAVIRUS, NAA: SARS-CoV-2, NAA: NOT DETECTED

## 2019-06-01 ENCOUNTER — Encounter: Payer: Self-pay | Admitting: Emergency Medicine

## 2019-06-01 ENCOUNTER — Other Ambulatory Visit: Payer: Self-pay

## 2019-06-01 ENCOUNTER — Emergency Department
Admission: EM | Admit: 2019-06-01 | Discharge: 2019-06-01 | Discharge status: Against Medical Advice | Payer: Medicaid Other | Attending: Emergency Medicine | Admitting: Emergency Medicine

## 2019-06-01 DIAGNOSIS — Z5321 Procedure and treatment not carried out due to patient leaving prior to being seen by health care provider: Secondary | ICD-10-CM | POA: Insufficient documentation

## 2019-06-01 DIAGNOSIS — R1030 Lower abdominal pain, unspecified: Secondary | ICD-10-CM | POA: Diagnosis not present

## 2019-06-01 DIAGNOSIS — R300 Dysuria: Secondary | ICD-10-CM | POA: Diagnosis not present

## 2019-06-01 DIAGNOSIS — R1110 Vomiting, unspecified: Secondary | ICD-10-CM | POA: Diagnosis present

## 2019-06-01 LAB — CBC WITH DIFFERENTIAL/PLATELET
Abs Immature Granulocytes: 0.04 10*3/uL (ref 0.00–0.07)
Basophils Absolute: 0.1 10*3/uL (ref 0.0–0.1)
Basophils Relative: 1 %
Eosinophils Absolute: 0.3 10*3/uL (ref 0.0–0.5)
Eosinophils Relative: 3 %
HCT: 41.1 % (ref 36.0–46.0)
Hemoglobin: 13 g/dL (ref 12.0–15.0)
Immature Granulocytes: 0 %
Lymphocytes Relative: 24 %
Lymphs Abs: 2.3 10*3/uL (ref 0.7–4.0)
MCH: 29.2 pg (ref 26.0–34.0)
MCHC: 31.6 g/dL (ref 30.0–36.0)
MCV: 92.4 fL (ref 80.0–100.0)
Monocytes Absolute: 0.7 10*3/uL (ref 0.1–1.0)
Monocytes Relative: 7 %
Neutro Abs: 6.1 10*3/uL (ref 1.7–7.7)
Neutrophils Relative %: 65 %
Platelets: 181 10*3/uL (ref 150–400)
RBC: 4.45 MIL/uL (ref 3.87–5.11)
RDW: 13.4 % (ref 11.5–15.5)
WBC: 9.5 10*3/uL (ref 4.0–10.5)
nRBC: 0 % (ref 0.0–0.2)

## 2019-06-01 LAB — COMPREHENSIVE METABOLIC PANEL
ALT: 15 U/L (ref 0–44)
AST: 21 U/L (ref 15–41)
Albumin: 4.1 g/dL (ref 3.5–5.0)
Alkaline Phosphatase: 74 U/L (ref 38–126)
Anion gap: 8 (ref 5–15)
BUN: 14 mg/dL (ref 6–20)
CO2: 23 mmol/L (ref 22–32)
Calcium: 8.1 mg/dL — ABNORMAL LOW (ref 8.9–10.3)
Chloride: 107 mmol/L (ref 98–111)
Creatinine, Ser: 0.61 mg/dL (ref 0.44–1.00)
GFR calc Af Amer: >60 mL/min (ref >60)
GFR calc non Af Amer: >60 mL/min (ref >60)
Glucose, Bld: 132 mg/dL — ABNORMAL HIGH (ref 70–99)
Potassium: 3.5 mmol/L (ref 3.5–5.1)
Sodium: 138 mmol/L (ref 135–145)
Total Bilirubin: 0.4 mg/dL (ref 0.3–1.2)
Total Protein: 6.8 g/dL (ref 6.5–8.1)

## 2019-06-01 LAB — LIPASE, BLOOD: Lipase: 30 U/L (ref 11–51)

## 2019-06-01 NOTE — ED Triage Notes (Addendum)
Pt presents to ED via EMS with frequent vomiting for the past 45 min. Pt states she did smoke marijuana around 1930. Vomit appears red in emesis bag. Pt reports lower abd pain all day with painful urination. Pt did received 4mg  of zofran IM prior to arrival.

## 2019-06-24 ENCOUNTER — Encounter: Payer: Medicaid Other | Admitting: Obstetrics and Gynecology

## 2019-07-02 ENCOUNTER — Other Ambulatory Visit: Payer: Self-pay

## 2019-07-02 ENCOUNTER — Encounter: Payer: Self-pay | Admitting: Obstetrics and Gynecology

## 2019-07-02 ENCOUNTER — Ambulatory Visit (INDEPENDENT_AMBULATORY_CARE_PROVIDER_SITE_OTHER): Payer: Medicaid Other | Admitting: Obstetrics and Gynecology

## 2019-07-02 VITALS — BP 124/84 | HR 98 | Ht 61.0 in | Wt 157.7 lb

## 2019-07-02 DIAGNOSIS — R300 Dysuria: Secondary | ICD-10-CM | POA: Diagnosis not present

## 2019-07-02 DIAGNOSIS — B373 Candidiasis of vulva and vagina: Secondary | ICD-10-CM

## 2019-07-02 DIAGNOSIS — B3731 Acute candidiasis of vulva and vagina: Secondary | ICD-10-CM

## 2019-07-02 LAB — POCT URINALYSIS DIPSTICK
Bilirubin, UA: NEGATIVE
Blood, UA: NEGATIVE
Glucose, UA: NEGATIVE
Ketones, UA: NEGATIVE
Leukocytes, UA: NEGATIVE
Nitrite, UA: NEGATIVE
Protein, UA: NEGATIVE
Spec Grav, UA: 1.015 (ref 1.010–1.025)
Urobilinogen, UA: 0.2 E.U./dL
pH, UA: 6.5 (ref 5.0–8.0)

## 2019-07-02 MED ORDER — TERCONAZOLE 0.4 % VA CREA: 1.0000 | TOPICAL_CREAM | Freq: Every day | VAGINAL | 2 refills | Status: DC

## 2019-07-02 NOTE — Patient Instructions (Signed)
L-theanine 200mg  two times a day as needed.

## 2019-07-02 NOTE — Progress Notes (Signed)
  Subjective:     Patient ID: [name], female   DOB: 11/13/1985, 33 y.o.   MRN: [id number]  HPI Reports vaginitis the week before menses every month. Also PMS is worsening for the 2 weeks before each menses.   Has been using OTC monistat to treat it. Gets better but comes back each month. Denies STI concerns. Is sexually active with female partner.  Also frustrated over weight gain, stopped adipex due to anxiety. effexor helps but not during PMS (tried taking extra at that time).     Review of Systems  Genitourinary: Positive for vaginal discharge.  All other systems reviewed and are negative.      Objective:   Physical Exam A&Ox4 Well groomed female in no distress Blood pressure 124/84, pulse 98, height 5\' 1"  (1.549 m), weight 157 lb 11.2 oz (71.5 kg). Pelvic exam: normal external genitalia, vulva, vagina, cervix, uterus and adnexa, WET MOUNT done - results: lactobacilli, monilia.    Assessment:     PMS Yeast infection    Plan:     Counseled on findings and recommend terazol 7 to treat yeast. Boric acid capsules prn for prevention(sample given). Also recommend trying L-theanine daily the week before menses. RTC 6 weeks for recheck and pap as it is past due.  Zohal Reny,CNM

## 2019-07-08 ENCOUNTER — Other Ambulatory Visit: Payer: Medicaid Other

## 2019-07-08 ENCOUNTER — Other Ambulatory Visit: Payer: Self-pay

## 2019-07-08 DIAGNOSIS — N926 Irregular menstruation, unspecified: Secondary | ICD-10-CM

## 2019-07-09 ENCOUNTER — Telehealth: Payer: Self-pay | Admitting: Obstetrics and Gynecology

## 2019-07-09 LAB — BETA HCG QUANT (REF LAB): hCG Quant: 8466 m[IU]/mL

## 2019-07-09 NOTE — Telephone Encounter (Signed)
The patient called and stated that she needs to speak with Amy in regards to confirming if she is able to take a medication for a yeast infection. Please advise.

## 2019-07-10 ENCOUNTER — Other Ambulatory Visit: Payer: Self-pay

## 2019-07-10 ENCOUNTER — Emergency Department: Payer: Medicaid Other

## 2019-07-10 ENCOUNTER — Emergency Department
Admission: EM | Admit: 2019-07-10 | Discharge: 2019-07-11 | Disposition: A | Discharge status: Home / Self Care | Payer: Medicaid Other | Attending: Student in an Organized Health Care Education/Training Program | Admitting: Student in an Organized Health Care Education/Training Program

## 2019-07-10 DIAGNOSIS — O209 Hemorrhage in early pregnancy, unspecified: Secondary | ICD-10-CM

## 2019-07-10 DIAGNOSIS — R519 Headache, unspecified: Secondary | ICD-10-CM

## 2019-07-10 DIAGNOSIS — N939 Abnormal uterine and vaginal bleeding, unspecified: Secondary | ICD-10-CM

## 2019-07-10 DIAGNOSIS — R110 Nausea: Secondary | ICD-10-CM | POA: Diagnosis not present

## 2019-07-10 DIAGNOSIS — Z3A01 Less than 8 weeks gestation of pregnancy: Secondary | ICD-10-CM | POA: Diagnosis not present

## 2019-07-10 DIAGNOSIS — F1210 Cannabis abuse, uncomplicated: Secondary | ICD-10-CM | POA: Diagnosis not present

## 2019-07-10 DIAGNOSIS — O26891 Other specified pregnancy related conditions, first trimester: Secondary | ICD-10-CM | POA: Diagnosis not present

## 2019-07-10 DIAGNOSIS — R51 Headache: Secondary | ICD-10-CM | POA: Insufficient documentation

## 2019-07-10 DIAGNOSIS — Z87891 Personal history of nicotine dependence: Secondary | ICD-10-CM | POA: Insufficient documentation

## 2019-07-10 LAB — URINALYSIS, COMPLETE (UACMP) WITH MICROSCOPIC
Bilirubin Urine: NEGATIVE
Glucose, UA: NEGATIVE mg/dL
Hgb urine dipstick: NEGATIVE
Ketones, ur: 20 mg/dL — AB
Leukocytes,Ua: NEGATIVE
Nitrite: NEGATIVE
Protein, ur: NEGATIVE mg/dL
Specific Gravity, Urine: 1.006 (ref 1.005–1.030)
pH: 7 (ref 5.0–8.0)

## 2019-07-10 LAB — COMPREHENSIVE METABOLIC PANEL
ALT: 16 U/L (ref 0–44)
AST: 19 U/L (ref 15–41)
Albumin: 4.4 g/dL (ref 3.5–5.0)
Alkaline Phosphatase: 59 U/L (ref 38–126)
Anion gap: 9 (ref 5–15)
BUN: 10 mg/dL (ref 6–20)
CO2: 23 mmol/L (ref 22–32)
Calcium: 8.9 mg/dL (ref 8.9–10.3)
Chloride: 107 mmol/L (ref 98–111)
Creatinine, Ser: 0.69 mg/dL (ref 0.44–1.00)
GFR calc Af Amer: >60 mL/min (ref >60)
GFR calc non Af Amer: >60 mL/min (ref >60)
Glucose, Bld: 100 mg/dL — ABNORMAL HIGH (ref 70–99)
Potassium: 4 mmol/L (ref 3.5–5.1)
Sodium: 139 mmol/L (ref 135–145)
Total Bilirubin: 0.6 mg/dL (ref 0.3–1.2)
Total Protein: 7.1 g/dL (ref 6.5–8.1)

## 2019-07-10 LAB — CBC
HCT: 44.5 % (ref 36.0–46.0)
Hemoglobin: 14.6 g/dL (ref 12.0–15.0)
MCH: 30 pg (ref 26.0–34.0)
MCHC: 32.8 g/dL (ref 30.0–36.0)
MCV: 91.4 fL (ref 80.0–100.0)
Platelets: 192 10*3/uL (ref 150–400)
RBC: 4.87 MIL/uL (ref 3.87–5.11)
RDW: 14.2 % (ref 11.5–15.5)
WBC: 9.6 10*3/uL (ref 4.0–10.5)
nRBC: 0 % (ref 0.0–0.2)

## 2019-07-10 LAB — ABO/RH: ABO/RH(D): B POS

## 2019-07-10 LAB — LIPASE, BLOOD: Lipase: 37 U/L (ref 11–51)

## 2019-07-10 LAB — HCG, QUANTITATIVE, PREGNANCY: hCG, Beta Chain, Quant, S: 28697 m[IU]/mL — ABNORMAL HIGH (ref <5)

## 2019-07-10 MED ORDER — DIPHENHYDRAMINE HCL 50 MG/ML IJ SOLN
12.5000 mg | Freq: Once | INTRAMUSCULAR | Status: AC
  Administered 2019-07-10: 12.5 mg via INTRAVENOUS
  Filled 2019-07-10: qty 1

## 2019-07-10 MED ORDER — ACETAMINOPHEN 500 MG PO TABS
1000.0000 mg | ORAL_TABLET | Freq: Once | ORAL | Status: AC
  Administered 2019-07-10: 1000 mg via ORAL
  Filled 2019-07-10: qty 2

## 2019-07-10 MED ORDER — SUCRALFATE 1 G PO TABS
1.0000 g | ORAL_TABLET | Freq: Once | ORAL | Status: AC
  Administered 2019-07-10: 1 g via ORAL
  Filled 2019-07-10: qty 1

## 2019-07-10 MED ORDER — SODIUM CHLORIDE 0.9 % IV BOLUS
1000.0000 mL | Freq: Once | INTRAVENOUS | Status: AC
  Administered 2019-07-10: 1000 mL via INTRAVENOUS

## 2019-07-10 MED ORDER — ONDANSETRON HCL 4 MG/2ML IJ SOLN
4.0000 mg | Freq: Once | INTRAMUSCULAR | Status: AC
  Administered 2019-07-10: 4 mg via INTRAVENOUS
  Filled 2019-07-10: qty 2

## 2019-07-10 MED ORDER — PROMETHAZINE HCL 25 MG/ML IJ SOLN
12.5000 mg | Freq: Four times a day (QID) | INTRAMUSCULAR | Status: DC | PRN
  Administered 2019-07-10: 12.5 mg via INTRAVENOUS
  Filled 2019-07-10: qty 1

## 2019-07-10 MED ORDER — PROMETHAZINE HCL 25 MG/ML IJ SOLN
12.5000 mg | Freq: Once | INTRAMUSCULAR | Status: AC
  Administered 2019-07-10: 12.5 mg via INTRAVENOUS
  Filled 2019-07-10: qty 1

## 2019-07-10 MED ORDER — MAGNESIUM SULFATE 2 GM/50ML IV SOLN
2.0000 g | Freq: Once | INTRAVENOUS | Status: AC
  Administered 2019-07-10: 2 g via INTRAVENOUS
  Filled 2019-07-10: qty 50

## 2019-07-10 NOTE — ED Notes (Signed)
Verbalizes understanding of potential risks in pregnancy. States she had it last time she was pregnant. Violently dry heaving. Agreeable to IV phenergan

## 2019-07-10 NOTE — ED Notes (Signed)
Patient called out from [place].  It appears that the IV fluid bag came disconnected from patient's IV.  The IV bag was empty and there was some fluid in the floor.  It is difficult to tell how much patient received.

## 2019-07-10 NOTE — ED Triage Notes (Signed)
Reports awoke with migraine, nausea and dry heaving. Denies abdominal pain. Pt pregnant. Dry heaving at time of triage.

## 2019-07-10 NOTE — ED Notes (Signed)
Patient states that the nausea has improved some with the phenergan. Patient given an update on wait time. Patient verbalizes understanding.

## 2019-07-10 NOTE — ED Notes (Signed)
PT is in ultrasound

## 2019-07-10 NOTE — ED Provider Notes (Signed)
[facility] Emergency Department Provider Note    First MD Initiated Contact with Patient 07/10/19 2044     (approximate)  I have reviewed the triage vital signs and the nursing notes.   HISTORY  Chief Complaint Nausea and Migraine    HPI [name] is a 33 y.o. female plosive past medical history with a history of migraines presents the ER for evaluation of migraine headache associated nausea and vomiting.  Patient also recently learned that she was pregnant is also complaining of vaginal spotting and some intermittent cramping.  Denies any numbness or tingling.  No blurry vision but does endorse photophobia.  This is consistent with previous migraines for which she has required migraine cocktails.  No fevers    Past Medical History:  Diagnosis Date   Anxiety    Bipolar 1 disorder ([place])    Crohn's colitis ([place])    Depression    History of multiple concussions    Irritable bowel    Migraines    Multiple gastric ulcers    Ulcerative (chronic) enterocolitis ([place])    Vertigo    Family History  Problem Relation Age of Onset   Thyroid disease Mother    Cancer Father 18       prostate cancer   Thyroid disease Sister    Past Surgical History:  Procedure Laterality Date   RHINOPLASTY     Patient Active Problem List   Diagnosis Date Noted   Bacterial vaginal infection 02/05/2017   Anxiety and depression 08/30/2014   Poor venous access 06/12/2014   Bowel disease, 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 Authorizing Provider  ondansetron (ZOFRAN) 4 MG tablet Take 1 tablet (4 mg total) by mouth daily as needed. 07/11/19 07/10/20  Merlyn Lot, MD  pantoprazole (PROTONIX) 40 MG tablet Take 1 tablet (40 mg total) by mouth daily. 03/19/18 03/19/19  Rudene Re, MD  phentermine (ADIPEX-P) 37.5 MG tablet TAKE 1 TABLET BY MOUTH IN THE MORNING Patient not  taking: Reported on 07/02/2019 02/27/18   Shambley, Melody N, CNM  terconazole (TERAZOL 7) 0.4 % vaginal cream Place 1 applicator vaginally at bedtime. 07/02/19   Shambley, Melody N, CNM  venlafaxine XR (EFFEXOR-XR) 150 MG 24 hr capsule Take 150 mg by mouth daily with breakfast.    [provider]    Allergies Buprenorphine hcl, Morphine, Origanum oil, Demerol [meperidine], Imitrex [sumatriptan], Morphine and related, Seroquel  [quetiapine], Seroquel [quetiapine fumarate], Sumatriptan succinate, Citalopram, and Other    Social History Social History   Tobacco Use   Smoking status: Former Smoker    Quit date: 03/19/2017    Years since quitting: 2.3   Smokeless tobacco: Never Used  Substance Use Topics   Alcohol use: No   Drug use: Yes    Types: Marijuana    Review of Systems Patient denies headaches, rhinorrhea, blurry vision, numbness, shortness of breath, chest pain, edema, cough, abdominal pain, nausea, vomiting, diarrhea, dysuria, fevers, rashes or hallucinations unless otherwise stated above in HPI. ____________________________________________   PHYSICAL EXAM:  VITAL SIGNS: Vitals:   07/10/19 1623 07/10/19 2336  BP: (!) 143/99 115/85  Pulse: 85 75  Resp: 18 14  Temp: 97.9 F (36.6 C)   SpO2: 100% 99%    Constitutional: Alert and oriented.  Eyes: Conjunctivae are normal.  Head: Atraumatic. Nose: No congestion/rhinnorhea. Mouth/Throat: Mucous membranes are moist.   Neck: No stridor. Painless ROM.  Cardiovascular: Normal rate, regular rhythm. Grossly normal heart sounds.  Good peripheral circulation. Respiratory: Normal respiratory effort.  No retractions. Lungs CTAB. Gastrointestinal: Soft and nontender. No distention. No abdominal bruits. No CVA tenderness. Genitourinary:  Musculoskeletal: No lower extremity tenderness nor edema.  No joint effusions. Neurologic:  Normal speech and language. No gross focal neurologic deficits are appreciated. No facial  droop Skin:  Skin is warm, dry and intact. No rash noted. Psychiatric: Mood and affect are normal. Speech and behavior are normal.  ____________________________________________   LABS (all labs ordered are listed, but only abnormal results are displayed)  Results for orders placed or performed during the hospital encounter of 07/10/19 (from the past 24 hour(s))  Comprehensive metabolic panel     Status: Abnormal   Collection Time: 07/10/19  4:36 PM  Result Value Ref Range   Sodium 139 135 - 145 mmol/L   Potassium 4.0 3.5 - 5.1 mmol/L   Chloride 107 98 - 111 mmol/L   CO2 23 22 - 32 mmol/L   Glucose, Bld 100 (H) 70 - 99 mg/dL   BUN 10 6 - 20 mg/dL   Creatinine, Ser 0.69 0.44 - 1.00 mg/dL   Calcium 8.9 8.9 - 10.3 mg/dL   Total Protein 7.1 6.5 - 8.1 g/dL   Albumin 4.4 3.5 - 5.0 g/dL   AST 19 15 - 41 U/L   ALT 16 0 - 44 U/L   Alkaline Phosphatase 59 38 - 126 U/L   Total Bilirubin 0.6 0.3 - 1.2 mg/dL   GFR calc non Af Amer >60 >60 mL/min   GFR calc Af Amer >60 >60 mL/min   Anion gap 9 5 - 15  Lipase, blood     Status: None   Collection Time: 07/10/19  4:36 PM  Result Value Ref Range   Lipase 37 11 - 51 U/L  CBC     Status: None   Collection Time: 07/10/19  4:36 PM  Result Value Ref Range   WBC 9.6 4.0 - 10.5 K/uL   RBC 4.87 3.87 - 5.11 MIL/uL   Hemoglobin 14.6 12.0 - 15.0 g/dL   HCT 44.5 36.0 - 46.0 %   MCV 91.4 80.0 - 100.0 fL   MCH 30.0 26.0 - 34.0 pg   MCHC 32.8 30.0 - 36.0 g/dL   RDW 14.2 11.5 - 15.5 %   Platelets 192 150 - 400 K/uL   nRBC 0.0 0.0 - 0.2 %  hCG, quantitative, pregnancy     Status: Abnormal   Collection Time: 07/10/19  4:36 PM  Result Value Ref Range   hCG, Beta Chain, Quant, S 28,697 (H) <5 mIU/mL  ABO/Rh     Status: None (Preliminary result)   Collection Time: 07/10/19  4:36 PM  Result Value Ref Range   ABO/RH(D) PENDING   Urinalysis, Complete w Microscopic     Status: Abnormal   Collection Time: 07/10/19 10:28 PM  Result Value Ref Range    Color, Urine STRAW (A) YELLOW   APPearance CLEAR (A) CLEAR   Specific Gravity, Urine 1.006 1.005 - 1.030   pH 7.0 5.0 - 8.0   Glucose, UA NEGATIVE NEGATIVE mg/dL   Hgb urine dipstick NEGATIVE NEGATIVE   Bilirubin Urine NEGATIVE NEGATIVE   Ketones, ur 20 (A) NEGATIVE mg/dL   Protein, ur NEGATIVE NEGATIVE mg/dL   Nitrite NEGATIVE NEGATIVE   Leukocytes,Ua NEGATIVE NEGATIVE   RBC / HPF 0-5 0 - 5 RBC/hpf   WBC, UA 0-5 0 - 5 WBC/hpf  Bacteria, UA RARE (A) NONE SEEN   Squamous Epithelial / LPF 0-5 0 - 5  ABO/Rh     Status: None   Collection Time: 07/10/19 11:11 PM  Result Value Ref Range   ABO/RH(D)      B POS Performed at [facility], [address], [place], [place] [address]    ____________________________________________ ____________________________________________  RADIOLOGY  I personally reviewed all radiographic images ordered to evaluate for the above acute complaints and reviewed radiology reports and findings.  These findings were personally discussed with the patient.  Please see medical record for radiology report.  ____________________________________________   PROCEDURES  Procedure(s) performed:  Procedures    Critical Care performed: no ____________________________________________   INITIAL IMPRESSION / ASSESSMENT AND PLAN / ED COURSE  Pertinent labs & imaging results that were available during my care of the patient were reviewed by me and considered in my medical decision making (see chart for details).   DDX: Migraine, tension, cluster, hyperemesis, dehydration, ectopic, threatened miscarriage  [name] is a 33 y.o. who presents to the ED with symptoms as described above.  Patient complaining of migraine headache.  Will give migraine cocktail.  Will give IV fluids.  She is complaining of vaginal bleeding.  Have low suspicion for ectopic but given her bleeding and pelvic discomfort will order ultrasound to evaluate for IUP.  Clinical  Course as of Jul 11 3  Sat Jul 11, 2019  0000 Patient feels much improved.  She is tolerating oral hydration.  Work-up thus far is reassuring.  At this point I do believe she is stable appropriate for outpatient follow-up.   [PR]    Clinical Course User Index [PR] Merlyn Lot, MD    The patient was evaluated in Emergency Department today for the symptoms described in the history of present illness. He/she was evaluated in the context of the global COVID-19 pandemic, which necessitated consideration that the patient might be at risk for infection with the SARS-CoV-2 virus that causes COVID-19. Institutional protocols and algorithms that pertain to the evaluation of patients at risk for COVID-19 are in a state of rapid change based on information released by regulatory bodies including the CDC and federal and state organizations. These policies and algorithms were followed during the patient's care in the ED.  As part of my medical decision making, I reviewed the following data within the [facility] notes reviewed and incorporated, Labs reviewed, notes from prior ED visits and [place] Controlled Substance Database   ____________________________________________   FINAL CLINICAL IMPRESSION(S) / ED DIAGNOSES  Final diagnoses:  Vaginal bleeding  Bad headache  Vaginal bleeding in pregnancy, first trimester      NEW MEDICATIONS STARTED DURING THIS VISIT:  New Prescriptions   ONDANSETRON (ZOFRAN) 4 MG TABLET    Take 1 tablet (4 mg total) by mouth daily as needed.     Note:  This document was prepared using Dragon voice recognition software and may include unintentional dictation errors.    Merlyn Lot, MD 07/11/19 [phone]

## 2019-07-10 NOTE — ED Notes (Signed)
Pt states she is 6 1/[redacted] weeks pregnant.

## 2019-07-11 MED ORDER — ONDANSETRON HCL 4 MG PO TABS: 4.0000 mg | ORAL_TABLET | Freq: Every day | ORAL | 0 refills | Status: AC | PRN

## 2019-07-11 NOTE — Discharge Instructions (Addendum)

## 2019-07-12 LAB — URINE CULTURE: Culture: <10000 — AB

## 2019-07-13 ENCOUNTER — Telehealth: Payer: Self-pay | Admitting: Obstetrics and Gynecology

## 2019-07-13 NOTE — Telephone Encounter (Signed)
[facility] ED faxed patients ED visit notes to [facility] office. Pulled pts chart to confirm provider. Notes put in associated providers basket. Thank you.

## 2019-07-14 ENCOUNTER — Other Ambulatory Visit: Payer: Self-pay | Admitting: Obstetrics and Gynecology

## 2019-07-14 DIAGNOSIS — N926 Irregular menstruation, unspecified: Secondary | ICD-10-CM

## 2019-07-14 NOTE — Telephone Encounter (Signed)
Pt went to ED

## 2019-08-11 ENCOUNTER — Encounter: Payer: Medicaid Other | Admitting: Obstetrics and Gynecology

## 2019-11-02 ENCOUNTER — Telehealth: Payer: Self-pay | Admitting: Licensed Clinical Social Worker

## 2019-11-02 NOTE — Telephone Encounter (Signed)
Contacted patient to schedule appointment.

## 2019-11-02 NOTE — Telephone Encounter (Signed)
-----   Message from Shawnee Knapp, RN sent at 11/02/2019  9:49 AM EST ----- Regarding: referral Hi Estill Bamberg,  Happy New Year! This is a woman on my caseload who has a hx of anxiety and notes current anxiety. She would like to speak with you- can you please give her a call?   Her number is [phone].  Thank you, [name]

## 2019-11-26 ENCOUNTER — Ambulatory Visit: Payer: Self-pay | Admitting: Licensed Clinical Social Worker

## 2019-12-04 ENCOUNTER — Telehealth: Payer: Self-pay | Admitting: Family Medicine

## 2019-12-04 NOTE — Telephone Encounter (Signed)
patient didn't receive consent form, appt time 9:50 on 2/08

## 2019-12-07 ENCOUNTER — Encounter: Payer: Self-pay | Admitting: Licensed Clinical Social Worker

## 2019-12-07 ENCOUNTER — Ambulatory Visit: Payer: Medicaid Other | Admitting: Licensed Clinical Social Worker

## 2019-12-07 DIAGNOSIS — F411 Generalized anxiety disorder: Secondary | ICD-10-CM
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
## 2019-12-21 ENCOUNTER — Ambulatory Visit: Payer: Medicaid Other | Admitting: Licensed Clinical Social Worker

## 2019-12-21 ENCOUNTER — Encounter: Payer: Self-pay | Admitting: Licensed Clinical Social Worker

## 2019-12-21 DIAGNOSIS — F411 Generalized anxiety disorder: Secondary | ICD-10-CM

## 2019-12-21 NOTE — Progress Notes (Signed)
Counselor/Therapist Progress Note  Patient ID: [name], MRN: [id number],    Date: 12/21/2019  Time Spent: 45 minutes    Treatment Type: Individual Therapy  Reported Symptoms: overall managed anxiety, with mild anxious thoughts; denies any panic attacks since 07/2019  Mental Status Exam:  Appearance:   Casual     Behavior:  Appropriate and Sharing  Motor:  Normal  Speech/Language:   Normal Rate  Affect:  Appropriate  Mood:  euthymic  Thought process:  normal  Thought content:    WNL  Sensory/Perceptual disturbances:    WNL  Orientation:  oriented to person, place, time/date, situation and day of week  Attention:  Good  Concentration:  Good  Memory:  WNL  Fund of knowledge:   Good  Insight:    Good  Judgment:   Good  Impulse Control:  Good   Risk Assessment: Danger to Self:  No Self-injurious Behavior: No Danger to Others: No Duty to Warn:no Physical Aggression / Violence:No  Access to Firearms a concern: No  Gang Involvement:No   Subjective: Patient was engaged and cooperative throughout the session using time effectively to discuss thoughts and feelings. Patient voices continued motivation for treatment and understanding of anxiety issues. Patient is likely to benefit from future treatment because she remains motivated to continue to manage anxiety symptoms and maintain functioning without medication management.  Interventions: Cognitive Behavioral Therapy  Established psychological safety.  Checked in with patient and reviewed previous session, including assessment and goal of treatment. Reviewed CBTs. Explored patient's goal of treatment and worked collaboratively to develop CBT treatment plan. Provided Psychoeducation on mindfulness, engaged patient in mindfulness exercise, processed exercise, and contracted with patient to practice mindfulness breathing. In addition, LCSW encouraged patient to speak with provider about difficulties breathing when going on a walk.  Provided support through active listening, validation of feelings, and highlighted patient's strengths.  Diagnosis:   ICD-10-CM   1. Generalized anxiety disorder  F41.1     Plan: Implement GAD screening. Continue meeting as requested by patient. Continue Zoom Sessions.   Patient's Goal: Having an extra set of eyes on the situation;learning management and coping strategies.  Treatment Target: Understand the relationship between thoughts, emotions, and behaviors  - Psychoeducation on CBT model   - Teach the connection between thoughts, emotions, and behaviors  Treatment Target: Increase realistic balanced thinking -to learn how to replace thinking with thoughts that are more accurate or helpful - Explore patient's thoughts, beliefs, automatic thoughts, assumptions  - Identify hot thoughts (upsetting ideas, self-talk and mental images) - Process distress and allow for emotional release  - Process distress related to trauma  - Evaluate thoughts - Cognitive reappraisal  - Restructuring, Socratic questioning  Treatment Target: Increase coping skills - Value driven Self-care  - Teach mindfulness-  focus on awareness of thoughts and feelings without attachment or judgment - Practice Mindfulness  Future Appointments  Date Time Provider [place]  01/18/2020  9:30 AM Milton Ferguson, LCSW [facility] None    Interpreter used: NA   Milton Ferguson, LCSW

## 2020-01-18 ENCOUNTER — Ambulatory Visit: Payer: Medicaid Other | Admitting: Licensed Clinical Social Worker

## 2020-01-18 DIAGNOSIS — F411 Generalized anxiety disorder: Secondary | ICD-10-CM

## 2020-01-18 NOTE — Progress Notes (Signed)
Counselor/Therapist Progress Note  Patient ID: [name], MRN: [id number],    Date: 01/18/2020  Time Spent: 46 minutes    Treatment Type: Individual Therapy  Reported Symptoms: difficulties relaxing,anxiousness, physical discomfort/pain  Mental Status Exam:  Appearance:   Casual     Behavior:  Appropriate and Sharing  Motor:  Normal  Speech/Language:   Normal Rate  Affect:  Appropriate  Mood:  normal  Thought process:  normal  Thought content:    WNL  Sensory/Perceptual disturbances:    WNL  Orientation:  oriented to person, place, time/date, situation and day of week  Attention:  Good  Concentration:  Good  Memory:  WNL  Fund of knowledge:   Good  Insight:    Good  Judgment:   Good  Impulse Control:  Good   Risk Assessment: Danger to Self:  No Self-injurious Behavior: No Danger to Others: No Duty to Warn:no Physical Aggression / Violence:No  Access to Firearms a concern: No  Gang Involvement:No   Subjective: Patient was engaged and cooperative throughout the session using time effectively to discuss thoughts and feelings. Patient voices continued motivation for treatment and understanding of anxiety issues. Patient is likely to benefit from future treatment because she is motivated to continue to manage symptoms and maintain functioning.  Interventions: Cognitive Behavioral Therapy Established psychological safety. Checked in with patient and engaged patient in processing current psychosocial stressors, continued mild anxiousness; physical discomfort related to pregnancy. Checked in with patient regarding mindfulness exercises and further explained mindfulness and described mindfulness stretching. Engaged patient in identifying helpful coping techniques that can be used at this time, including putting together puzzle, and sitting outside - weather permitting. Provided support through active listening, validation of feelings, and highlighted patient's strengths.    Diagnosis:   ICD-10-CM   1. Generalized anxiety disorder  F41.1     Plan: Having an extra set of eyes on the situation;learning management and coping strategies.  Treatment Target: Understand the relationship between thoughts, emotions, and behaviors   Psychoeducation on CBT model    Teach the connection between thoughts, emotions, and behaviors  Treatment Target: Increase realistic balanced thinking -to learn how to replace thinking with thoughts that are more accurate or helpful  Explore patient's thoughts, beliefs, automatic thoughts, assumptions   Identify hot thoughts(upsetting ideas, self-talk and mental images)  Process distress and allow for emotional release   Process distress related to trauma   Evaluate thoughts  Cognitive reappraisal   Restructuring, Socratic questioning  Treatment Target: Increase coping skills  Value driven Self-care   Teach mindfulness- focus on awareness of thoughts and feelings without attachment or judgment  Practice Mindfulness  Future Appointments  Date Time Provider [place]  02/15/2020  9:30 AM Milton Ferguson, LCSW [facility] None    Interpreter used: NA   Milton Ferguson, LCSW

## 2020-02-15 ENCOUNTER — Ambulatory Visit: Payer: Medicaid Other | Admitting: Licensed Clinical Social Worker

## 2020-02-15 DIAGNOSIS — F411 Generalized anxiety disorder: Secondary | ICD-10-CM

## 2020-02-15 NOTE — Progress Notes (Signed)
Counselor/Therapist Progress Note  Patient ID: [name], MRN: [id number],    Date: 02/15/2020  Time Spent: 50 minutes   Treatment Type: Individual Therapy  Reported Symptoms: anxiety, anxious thoughts; low mood secondary to family stressors   Mental Status Exam:   Appearance:   NA     Behavior:  Appropriate and Sharing  Motor:  NA  Speech/Language:   Normal Rate  Affect:  NA  Mood:  normal  Thought process:  normal  Thought content:    WNL  Sensory/Perceptual disturbances:    WNL  Orientation:  oriented to person, place, time/date and situation  Attention:  Good  Concentration:  Good  Memory:  WNL  Fund of knowledge:   Good  Insight:    Good  Judgment:   Good  Impulse Control:  Good   Risk Assessment: Danger to Self:  No Self-injurious Behavior: No Danger to Others: No Duty to Warn:no Physical Aggression / Violence:No  Access to Firearms a concern: No  Gang Involvement:No   Subjective: Patient was engaged and cooperative throughout the session using time effectively to discuss thoughts and feelings. Patient voices continued motivation for treatment and understanding of anxiety symptoms. Patient is likely to benefit from future treatment because she remains motivated to decrease symptoms and reports benefit of regular sessions in addressing these symptoms.    Interventions: Cognitive Behavioral Therapy  Established psychological safety. Checked in with patient and assessed symptoms and current psychosocial stressors, increased stressors related to family relationships and medical concerns with current pregnancy. Explored patient's experience with relationship challenges with family, validating patient's feelings of guilt and sadness. Discussed boundaries, living within values, and having a postpartum plan. Provided support through active listening, validation of feelings, and highlighted patient's strengths.   Diagnosis:   ICD-10-CM   1. Generalized anxiety disorder   F41.1     Plan: Understand the relationship between thoughts, emotions, and behaviors   Psychoeducation on CBT model   Teach the connection between thoughts, emotions, and behaviors  Treatment Target: Increase realistic balanced thinking -to learn how to replace thinking with thoughts that are more accurate or helpful  Explore patient's thoughts, beliefs, automatic thoughts, assumptions   Identify hot thoughts(upsetting ideas, self-talk and mental images)  Process distress and allow for emotional release   Process distress related to trauma   Evaluate thoughts  Cognitive reappraisal   Restructuring, Socratic questioning Treatment Target: Increase coping skills  Value driven Self-care   Teach mindfulness- focus on awareness of thoughts and feelings without attachment or judgment  Practice Mindfulness  Future Appointments  Date Time Provider [place]  02/22/2020 12:00 PM Milton Ferguson, LCSW [facility] None    Interpreter used: NA   Milton Ferguson, LCSW

## 2020-02-22 ENCOUNTER — Ambulatory Visit: Payer: Medicaid Other | Admitting: Licensed Clinical Social Worker

## 2020-02-22 DIAGNOSIS — F411 Generalized anxiety disorder: Secondary | ICD-10-CM

## 2020-02-22 NOTE — Progress Notes (Signed)
Counselor/Therapist Progress Note  Patient ID: [name], MRN: [id number],    Date: 02/22/2020  Time Spent: 42 minutes   Treatment Type: Individual Therapy  Reported Symptoms: over all stability, moments of emotionality, mild overwhelm  Mental Status Exam:  Appearance:   Casual     Behavior:  Appropriate and Sharing  Motor:  Normal  Speech/Language:   Normal Rate  Affect:  Appropriate  Mood:  normal  Thought process:  normal  Thought content:    WNL  Sensory/Perceptual disturbances:    WNL  Orientation:  oriented to person, place, time/date and situation  Attention:  Good  Concentration:  Good  Memory:  WNL  Fund of knowledge:   Good  Insight:    Good  Judgment:   Good  Impulse Control:  Good   Risk Assessment: Danger to Self:  No Self-injurious Behavior: No Danger to Others: No Duty to Warn:no Physical Aggression / Violence:No  Access to Firearms a concern: No  Gang Involvement:No   Subjective: Patient was engaged and cooperative throughout the session using time effectively to discuss thoughts and feelings. Patient denies any significant concerns at this time and voices continued motivation for treatment and understanding of anxiety issues. Patient is likely to benefit from future treatment because she remains motivated to manage symptoms and maintain functioning and reports benefit of regular sessions in addressing these symptoms.    Interventions: Cognitive Behavioral Therapy  Established psychological safety. Checked in with patient and conducted brief mood check and discussed recent birth expereince and adjustment to new baby. Patient voices overall stability in mood and adjusting well to new baby; she does reports some worries about potential health issue. Taught patient about mindfulness strategies to use while breastfeeding and also discussed using mindfulness while showering. Encouraged patient to continue to engage with support system, continue fluid intake,  eating regularly, to get out side, and to begin to incorporate mindfulness moments throughout each day.   Diagnosis:   ICD-10-CM   1. Generalized anxiety disorder  F41.1     Plan: Understand the relationship between thoughts, emotions, and behaviors   Psychoeducation on CBT model   Teach the connection between thoughts, emotions, and behaviors  Treatment Target: Increase realistic balanced thinking -to learn how to replace thinking with thoughts that are more accurate or helpful  Explore patient's thoughts, beliefs, automatic thoughts, assumptions   Identify hot thoughts(upsetting ideas, self-talk and mental images)  Process distress and allow for emotional release   Process distress related to trauma   Evaluate thoughts  Cognitive reappraisal   Restructuring, Socratic questioning Treatment Target: Increase coping skills  Value driven Self-care   Teach mindfulness- focus on awareness of thoughts and feelings without attachment or judgment  Practice Mindfulness  Future Appointments  Date Time Provider [place]  03/07/2020 10:30 AM Milton Ferguson, LCSW [facility] None    Interpreter used: NA   Milton Ferguson, LCSW

## 2020-03-07 ENCOUNTER — Ambulatory Visit: Payer: Medicaid Other | Admitting: Licensed Clinical Social Worker

## 2020-03-07 DIAGNOSIS — F411 Generalized anxiety disorder: Secondary | ICD-10-CM

## 2020-03-07 NOTE — Progress Notes (Signed)
Counselor/Therapist Progress Note  Patient ID: KIMALA MCCLEESE, MRN: [id number],    Date: 03/07/2020  Time Spent: 48 minutes   Treatment Type: Individual Therapy  Reported Symptoms: decrease in emotionality, overall mood improvement; continued anxiety, anxious thoughts, irritability; increased anxiety   Mental Status Exam:  Appearance:   Casual     Behavior:  Appropriate  Motor:  Normal  Speech/Language:   Normal Rate  Affect:  Appropriate and Congruent  Mood:  normal  Thought process:  normal  Thought content:    WNL  Sensory/Perceptual disturbances:    WNL  Orientation:  oriented to person, place, time/date, situation and day of week  Attention:  Good  Concentration:  Good  Memory:  WNL  Fund of knowledge:   Good  Insight:    Good  Judgment:   Good  Impulse Control:  Good   Risk Assessment: Danger to Self:  No Self-injurious Behavior: No Danger to Others: No Duty to Warn:no Physical Aggression / Violence:No  Access to Firearms a concern: No  Gang Involvement:No   Subjective: Patient was engaged and cooperative throughout the session using time effectively to discuss thoughts and feelings. Patient voices continued motivation for treatment and understanding of anxiety issues. Patient is likely to benefit from future treatment because  remains motivated to decrease anxiety and reports benefit of regular sessions in addressing these symptoms.   Interventions: Cognitive Behavioral Therapy  Established psychological safety.  Engaged patient in processing current psychosocial stressors, continued anxiety; family conflict. Actively listened to patient while she expressed her concerns, providing empathic reflections and encouraged a range of alternative ways to view her situation promoting psychological flexibility. Encouraged patient to continue to seek social supports though friendships and encouraged patient to practice mindfulness. Provided support through active listening,  validation of feelings, and highlighted patient's strengths.   Diagnosis:   ICD-10-CM   1. Generalized anxiety disorder  F41.1     Plan: Understand the relationship between thoughts, emotions, and behaviors   Psychoeducation on CBT model   Teach the connection between thoughts, emotions, and behaviors  Treatment Target: Increase realistic balanced thinking -to learn how to replace thinking with thoughts that are more accurate or helpful  Explore patient's thoughts, beliefs, automatic thoughts, assumptions   Identify hot thoughts(upsetting ideas, self-talk and mental images)  Process distress and allow for emotional release   Process distress related to trauma   Evaluate thoughts  Cognitive reappraisal   Restructuring, Socratic questioning Treatment Target: Increase coping skills  Value driven Self-care   Teach mindfulness- focus on awareness of thoughts and feelings without attachment or judgment  Practice Mindfulness  Future Appointments  Date Time Provider [place]  04/04/2020  9:30 AM Milton Ferguson, LCSW [facility] None    Interpreter used: NA   Milton Ferguson, LCSW

## 2020-03-10 IMAGING — US US OB < 14 WKS - US OB TV - US DOPPLER
1 series · 13 of 28 positions shown · non-contrast
Comparison: None.

CLINICAL DATA: Pregnant patient in first-trimester pregnancy with
vaginal bleeding.



[Series 1: us ob < 14 wks - us ob tv - us doppler · 13 of 101 slices shown]
[im 4/101]
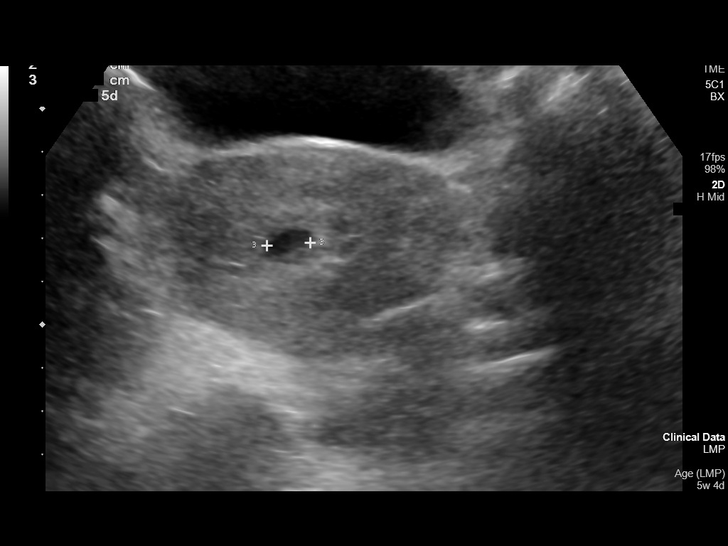
[im 12/101]
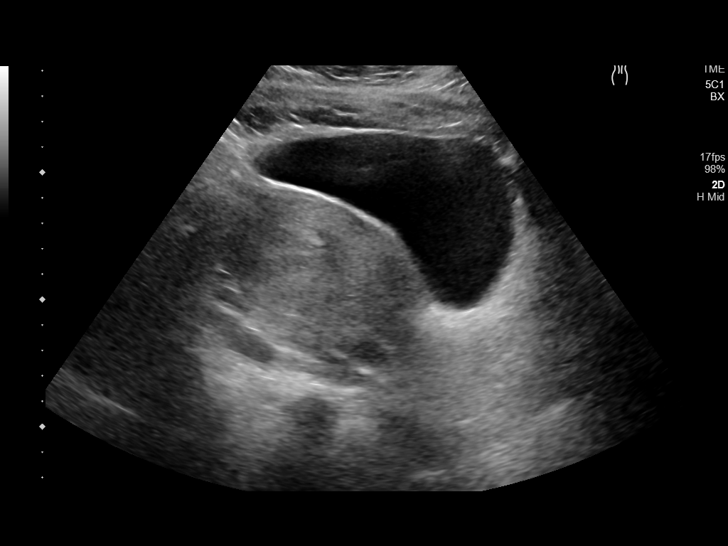
[im 19/101]
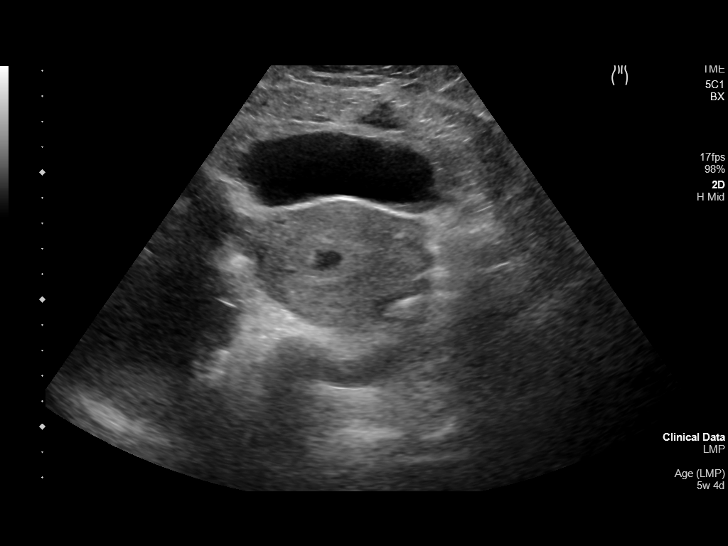
[im 26/101]
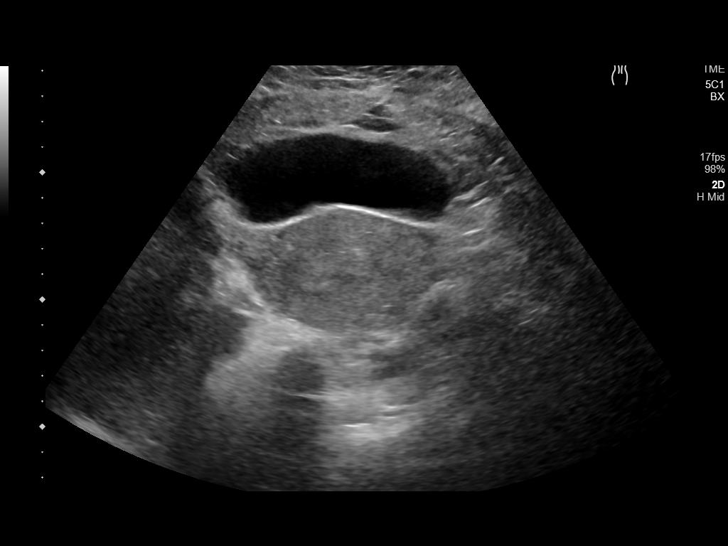
[im 34/101]
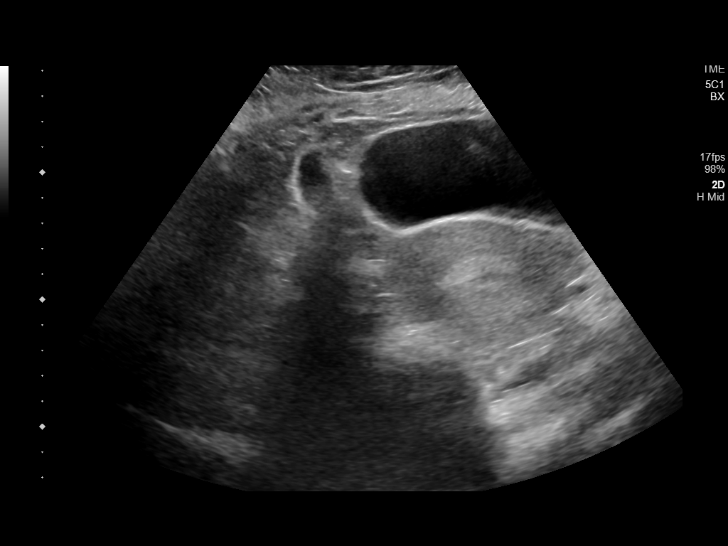
[im 41/101]
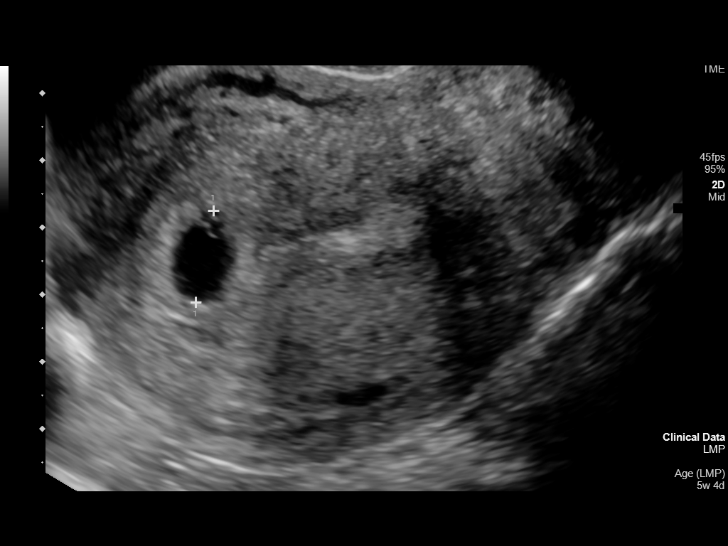
[im 52/101]
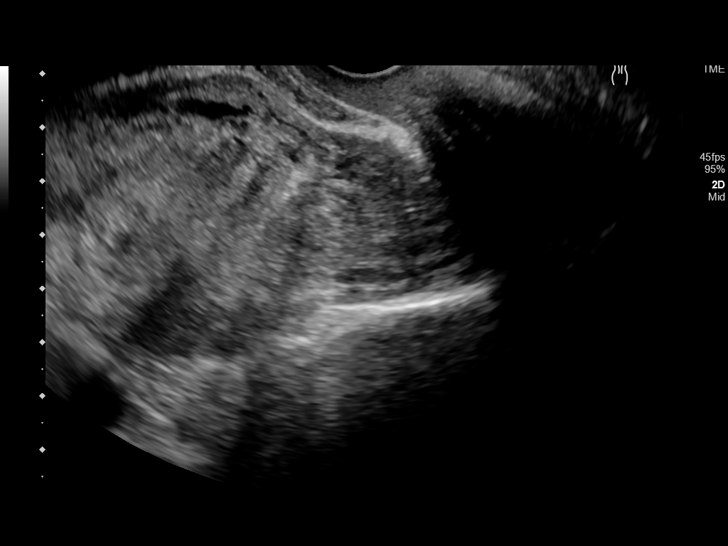
[im 60/101]
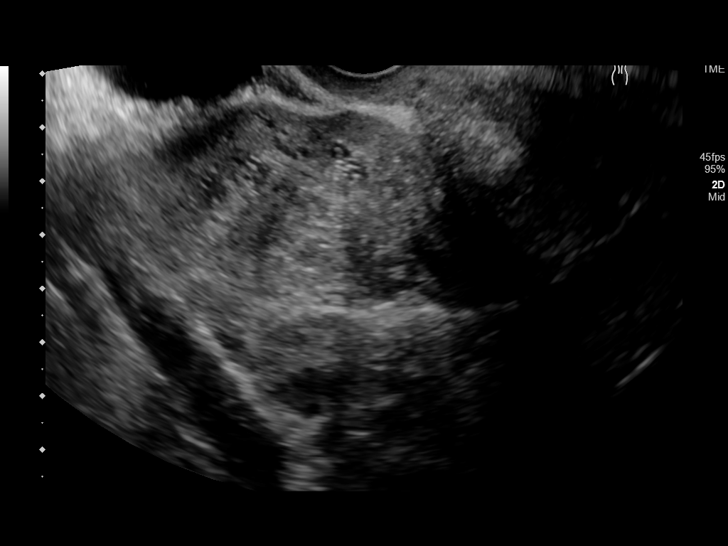
[im 67/101]
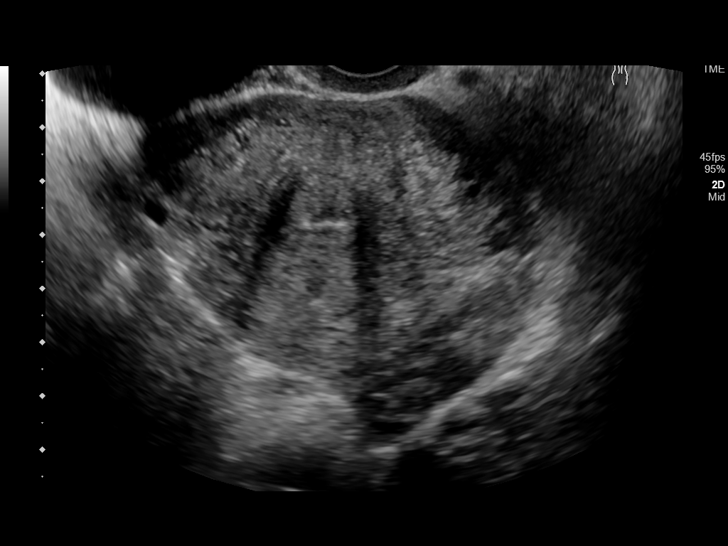
[im 75/101]
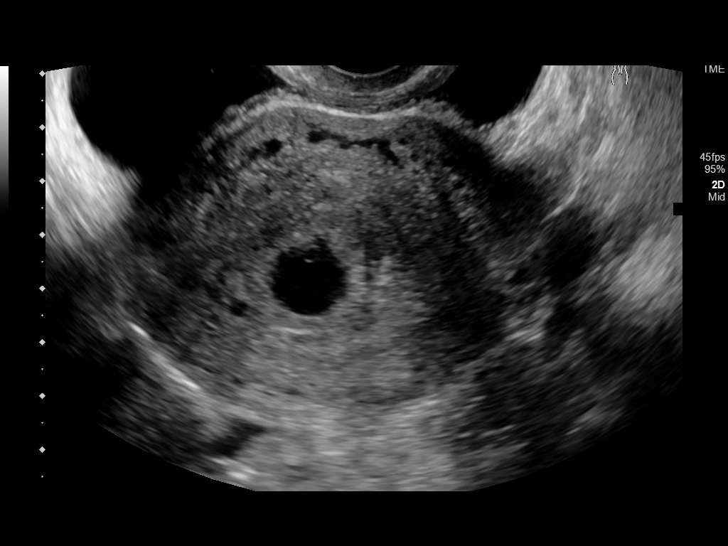
[im 82/101]
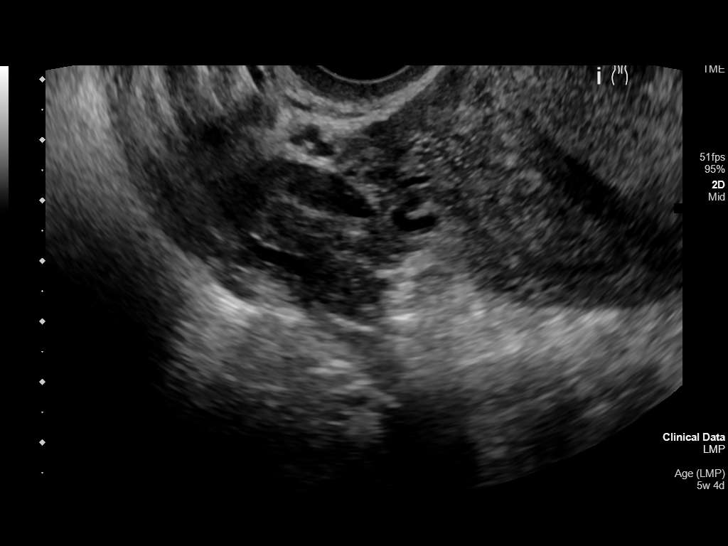
[im 89/101]
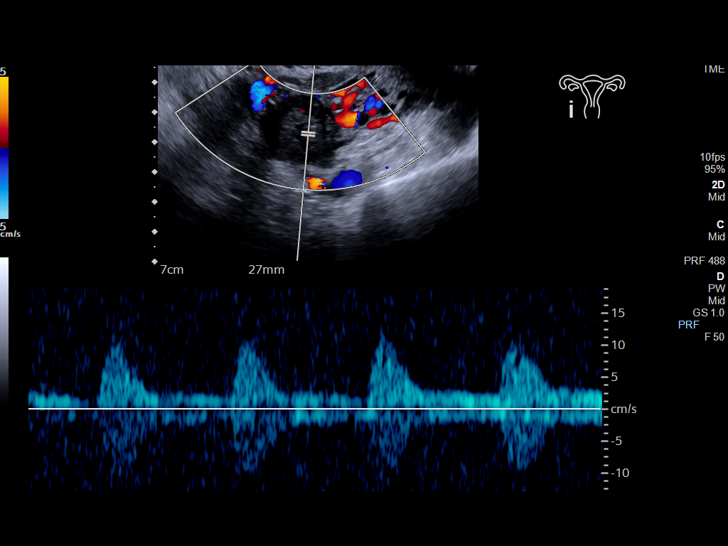
[im 97/101]
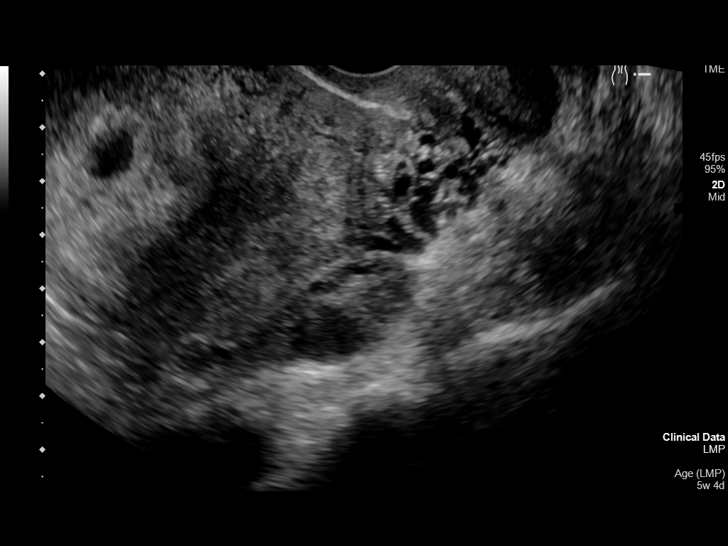

[13 of 28 positions shown; findings below may reference images not displayed]

FINDINGS: Intrauterine gestational sac: Single

Yolk sac:  Visualized.

Embryo:  Not Visualized.

Cardiac Activity: Not Visualized.

MSD: 14 mm   6 w   2 d

Subchorionic hemorrhage:  None visualized.

Maternal uterus/adnexae: The right ovary measures 3.4 x 2.0 x 2.9 cm
with normal blood flow. Left ovary measures 3.3 x 1.7 x 2.3 cm with
blood flow. No suspicious adnexal mass. Trace pelvic free fluid.

Pulsed Doppler evaluation of both ovaries demonstrates normal
appearing low-resistance arterial and venous waveforms.
IMPRESSION: 1. Intrauterine gestational sac with a yolk sac but no fetal pole or
cardiac activity yet demonstrated. Recommend continued trending of
beta HCG and follow-up ultrasound as indicated.
2. No subchorionic hemorrhage.
3. Normal ovaries with blood flow.

## 2020-04-04 ENCOUNTER — Ambulatory Visit: Payer: Medicaid Other | Admitting: Licensed Clinical Social Worker

## 2020-04-04 DIAGNOSIS — F411 Generalized anxiety disorder: Secondary | ICD-10-CM

## 2020-04-04 NOTE — Progress Notes (Signed)
Counselor/Therapist Progress Note  Patient ID: [name], MRN: [id number],    Date: 04/04/2020  Time Spent: 45 minutes   Treatment Type: Individual Therapy  Reported Symptoms: mild depressive symptoms; mild anxiety, anxious thoughts, irritaility, sleep disturbance secondary to waking with new baby  Mental Status Exam:  Appearance:   Casual     Behavior:  Appropriate and Sharing  Motor:  Normal  Speech/Language:   Normal Rate  Affect:  Appropriate  Mood:  normal  Thought process:  normal  Thought content:    WNL  Sensory/Perceptual disturbances:    WNL  Orientation:  oriented to person, place, time/date, situation and day of week  Attention:  Good  Concentration:  Good  Memory:  WNL  Fund of knowledge:   Good  Insight:    Good  Judgment:   Good  Impulse Control:  Good   Risk Assessment: Danger to Self:  No Self-injurious Behavior: No Danger to Others: No Duty to Warn:no Physical Aggression / Violence:No  Access to Firearms a concern: No  Gang Involvement:No   Subjective: Patient was engaged and cooperative throughout the session using time effectively to discuss thoughts and feelings. Patient voices continued motivation for treatment and understanding of depression and anxiety issues. Patient is likely to benefit from future treatment because she remains motivated to decrease symptoms and improve functioning and reports benefit of regular sessions in addressing symptoms of anxiety/depression.   Interventions: Cognitive Behavioral Therapy  Established psychological safety. Checked in with patient  Engaged patient in processing current psychosocial stressors, mild anxiety and mood symptoms due to adjustment to new baby. Explored patient's perception of current adjustment challenges, validting patient's feelings. Discussed medication management as an option. Also, stratigiesed how to practice self-care, including 30 seconds each day of deep breaths.  Provided support through  active listening, validation of feelings, and highlighted patient's strengths.    Diagnosis:   ICD-10-CM   1. Generalized anxiety disorder  F41.1     Plan: Understand the relationship between thoughts, emotions, and behaviors   Psychoeducation on CBT model   Teach the connection between thoughts, emotions, and behaviors  Treatment Target: Increase realistic balanced thinking -to learn how to replace thinking with thoughts that are more accurate or helpful  Explore patient's thoughts, beliefs, automatic thoughts, assumptions   Identify hot thoughts(upsetting ideas, self-talk and mental images)  Process distress and allow for emotional release   Process distress related to trauma   Evaluate thoughts  Cognitive reappraisal   Restructuring, Socratic questioning Treatment Target: Increase coping skills  Value driven Self-care   Teach mindfulness- focus on awareness of thoughts and feelings without attachment or judgment  Practice Mindfulness  Future Appointments  Date Time Provider [place]  05/03/2020  9:30 AM Milton Ferguson, LCSW [facility] None    Interpreter used: NA   Milton Ferguson, LCSW

## 2020-05-03 ENCOUNTER — Ambulatory Visit: Payer: Medicaid Other | Admitting: Licensed Clinical Social Worker

## 2020-05-31 ENCOUNTER — Telehealth: Payer: Self-pay | Admitting: Licensed Clinical Social Worker

## 2020-05-31 ENCOUNTER — Ambulatory Visit: Payer: Medicaid Other | Admitting: Licensed Clinical Social Worker

## 2020-05-31 DIAGNOSIS — F411 Generalized anxiety disorder: Secondary | ICD-10-CM

## 2020-05-31 NOTE — Telephone Encounter (Signed)
-----   Message from Milton Ferguson, [place] sent at 05/31/2020  9:36 AM EDT ----- Regarding: follow up Follow up with patient

## 2020-05-31 NOTE — Telephone Encounter (Signed)
LCSW called and spoke with patient due to the abrupt end to our session. Patient reported everything was okay. LCSW offered to reschedule, patient declined sharing that she was good at this time. LCSW encouraged patient to reach out in the future, as needed.

## 2020-05-31 NOTE — Progress Notes (Signed)
LCSW began session with patient. During check-in patient was interrupted by her mom - patient shared that her mom was pale and apologized but needed to end the session.

## 2021-10-06 ENCOUNTER — Telehealth: Payer: Medicaid Other

## 2022-06-01 ENCOUNTER — Inpatient Hospital Stay
Admission: RE | Admit: 2022-06-01 | Discharge: 2022-06-01 | Disposition: A | Discharge status: Home / Self Care | Payer: Medicaid Other | Source: Ambulatory Visit

## 2022-06-15 ENCOUNTER — Ambulatory Visit
Admission: RE | Admit: 2022-06-15 | Discharge: 2022-06-15 | Disposition: A | Discharge status: Home / Self Care | Payer: Medicaid Other | Source: Ambulatory Visit | Attending: Emergency Medicine | Admitting: Emergency Medicine

## 2022-06-15 VITALS — BP 132/96 | HR 64 | Temp 98.2°F | Resp 14 | Ht 61.0 in | Wt 150.0 lb

## 2022-06-15 DIAGNOSIS — J0140 Acute pansinusitis, unspecified: Secondary | ICD-10-CM | POA: Diagnosis not present

## 2022-06-15 MED ORDER — FLUTICASONE PROPIONATE 50 MCG/ACT NA SUSP: 1.0000 | Freq: Every day | NASAL | 0 refills | Status: DC

## 2022-06-15 MED ORDER — AMOXICILLIN-POT CLAVULANATE 875-125 MG PO TABS: 1.0000 | ORAL_TABLET | Freq: Two times a day (BID) | ORAL | 0 refills | Status: AC

## 2022-06-15 NOTE — Discharge Instructions (Signed)
Today you are being treated for a sinus infection  Take Augmentin every morning and every evening for the next 10 days, ideally you will begin to see improvement in about 48 hours  Begin use of Flonase every morning to help clear sinus passages  You may continue over-the-counter sinus medications and Coricidin as needed with avoidance of decongestants due to it causing palpitations and tachycardia  If your symptoms continue to persist or worsen even past use of medicine please follow-up with urgent care as needed for reevaluation

## 2022-06-15 NOTE — ED Triage Notes (Signed)
Patient c/o sinus congestion and pressure that started 3 days ago.  Patient reports dizziness that started yesterday.  Patient denies fevers.  Patient reports reports neck stiffness and pain that started today.

## 2022-06-15 NOTE — ED Provider Notes (Signed)
[facility]    CSN: [id number] Arrival date & time: 06/15/22  1552      History   Chief Complaint Chief Complaint  Patient presents with  . Dizziness  . Sinus Problem    HPI [name] is a 36 y.o. female.   Patient presents with nasal congestion, rhinorrhea, bilateral ear fullness, postnasal drip, sinus pain and pressure, generalized sinus headache, dizziness described as feeling off balance which elicits lightheadedness for 3 days.  Known sick contacts in household.  Tolerating food and liquids.  Has attempted use of Coricidin and Tylenol which have been minimally effective.  Denies fever, chills, body aches, cough, shortness of breath or wheezing.  History of migraines, vertigo, sinusitis requiring rhinoplasty.   Past Medical History:  Diagnosis Date  . Anxiety   . Bipolar 1 disorder ([place])   . Crohn's colitis ([place])   . Depression   . History of multiple concussions   . Irritable bowel   . Migraines   . Multiple gastric ulcers   . 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 Vertigo     Patient Active Problem List   Diagnosis Date Noted  . Bacterial vaginal infection 02/05/2017  . Anxiety and depression 08/30/2014  . Poor venous access 06/12/2014  . Bowel disease, inflammatory 06/12/2014    Past Surgical History:  Procedure Laterality Date  . RHINOPLASTY    . TUBAL LIGATION      OB History     Gravida  2   Para  0   Term  0   Preterm  0   AB  0   Living  1      SAB  0   IAB  0   Ectopic  0   Multiple  0   Live Births  1            Home Medications    Prior to Admission medications   Medication Sig Start Date End Date Taking? Authorizing Provider  pantoprazole (PROTONIX) 40 MG tablet Take 1 tablet (40 mg total) by mouth daily. 03/19/18 03/19/19  Rudene Re, MD  phentermine (ADIPEX-P) 37.5 MG tablet TAKE 1 TABLET BY MOUTH IN THE MORNING Patient not taking: No sig reported 02/27/18   Shambley, Melody N, CNM   terconazole (TERAZOL 7) 0.4 % vaginal cream Place 1 applicator vaginally at bedtime. 07/02/19   Shambley, Melody N, CNM  venlafaxine XR (EFFEXOR-XR) 150 MG 24 hr capsule Take 150 mg by mouth daily with breakfast.    [provider]    Family History Family History  Problem Relation Age of Onset  . Thyroid disease Mother   . Bipolar disorder Mother   . OCD Mother   . Cancer Father 92       prostate cancer  . Thyroid disease Sister     Social History Social History   Tobacco Use  . Smoking status: Former    Types: Cigarettes    Quit date: 03/19/2017    Years since quitting: 5.2  . Smokeless tobacco: Never  Vaping Use  . Vaping Use: Never used  Substance Use Topics  . Alcohol use: No  . Drug use: Not Currently    Types: Marijuana    Comment: patient denies use      Allergies   Buprenorphine hcl, Morphine, Origanum oil, Demerol [meperidine], Imitrex [sumatriptan], Morphine and related, Seroquel  [quetiapine], Seroquel [quetiapine fumarate], Sumatriptan succinate, Citalopram, and Other   Review of Systems Review of Systems  Constitutional:  Negative.   HENT:  Positive for congestion, ear pain, postnasal drip, sinus pressure and sinus pain. Negative for dental problem, drooling, ear discharge, facial swelling, hearing loss, mouth sores, nosebleeds, rhinorrhea, sneezing, sore throat, tinnitus, trouble swallowing and voice change.   Respiratory: Negative.    Cardiovascular: Negative.   Skin: Negative.   Neurological:  Positive for dizziness, light-headedness and headaches. Negative for tremors, seizures, syncope, facial asymmetry, speech difficulty, weakness and numbness.     Physical Exam Triage Vital Signs ED Triage Vitals  Enc Vitals Group     BP 06/15/22 1607 (!) 132/96     Pulse Rate 06/15/22 1607 64     Resp 06/15/22 1607 14     Temp 06/15/22 1607 98.2 F (36.8 C)     Temp Source 06/15/22 1607 Oral     SpO2 06/15/22 1607 100 %     Weight 06/15/22 1605  150 lb (68 kg)     Height 06/15/22 1605 '5\' 1"'$  (1.549 m)     Head Circumference --      Peak Flow --      Pain Score 06/15/22 1605 3     Pain Loc --      Pain Edu? --      Excl. in [place]? --    No data found.  Updated Vital Signs BP (!) 132/96 (BP Location: Left Arm)   Pulse 64   Temp 98.2 F (36.8 C) (Oral)   Resp 14   Ht '5\' 1"'$  (1.549 m)   Wt 150 lb (68 kg)   LMP 05/25/2022 (Approximate)   SpO2 100%   Breastfeeding No   BMI 28.34 kg/m   Visual Acuity Right Eye Distance:   Left Eye Distance:   Bilateral Distance:    Right Eye Near:   Left Eye Near:    Bilateral Near:     Physical Exam Constitutional:      Appearance: Normal appearance.  HENT:     Head: Normocephalic.     Right Ear: Tympanic membrane, ear canal and external ear normal.     Left Ear: Tympanic membrane, ear canal and external ear normal.     Nose: Congestion and rhinorrhea present.     Right Sinus: Maxillary sinus tenderness and frontal sinus tenderness present.     Left Sinus: Maxillary sinus tenderness and frontal sinus tenderness present.     Mouth/Throat:     Mouth: Mucous membranes are moist.     Pharynx: Oropharynx is clear.  Eyes:     Extraocular Movements: Extraocular movements intact.  Cardiovascular:     Rate and Rhythm: Normal rate and regular rhythm.     Pulses: Normal pulses.     Heart sounds: Normal heart sounds.  Pulmonary:     Effort: Pulmonary effort is normal.     Breath sounds: Normal breath sounds.  Musculoskeletal:     Cervical back: Normal range of motion and neck supple.  Skin:    General: Skin is warm and dry.  Neurological:     Mental Status: She is alert and oriented to person, place, and time. Mental status is at baseline.  Psychiatric:        Mood and Affect: Mood normal.        Behavior: Behavior normal.     UC Treatments / Results  Labs (all labs ordered are listed, but only abnormal results are displayed) Labs Reviewed - No data to  display  EKG   Radiology No results found.  Procedures Procedures (  including critical care time)  Medications Ordered in UC Medications - No data to display  Initial Impression / Assessment and Plan / UC Course  I have reviewed the triage vital signs and the nursing notes.  Pertinent labs & imaging results that were available during my care of the patient were reviewed by me and considered in my medical decision making (see chart for details).  Acute nonrecurrent pansinusitis  Presentation is consistent with a sinus infection, discussed with patient, due to history of reoccurring infections will provide prophylactic treatment for bacteria, Augmentin and Flonase prescribed for outpatient treatment and patient may continue use of over-the-counter medications for additional supportive measures, may follow-up with urgent care as needed if symptoms persist or worsen, may also follow-up with ear nose and throat specialist Final Clinical Impressions(s) / UC Diagnoses   Final diagnoses:  None   Discharge Instructions   None    ED Prescriptions   None    PDMP not reviewed this encounter.   Hans Eden, [place] 06/15/22 [phone]

## 2022-07-17 ENCOUNTER — Ambulatory Visit
Admission: EM | Admit: 2022-07-17 | Discharge: 2022-07-17 | Disposition: A | Discharge status: Home / Self Care | Payer: Medicaid Other | Attending: Physician Assistant | Admitting: Physician Assistant

## 2022-07-17 ENCOUNTER — Ambulatory Visit (INDEPENDENT_AMBULATORY_CARE_PROVIDER_SITE_OTHER): Payer: Medicaid Other

## 2022-07-17 DIAGNOSIS — R0789 Other chest pain: Secondary | ICD-10-CM | POA: Diagnosis not present

## 2022-07-17 DIAGNOSIS — R112 Nausea with vomiting, unspecified: Secondary | ICD-10-CM

## 2022-07-17 DIAGNOSIS — K50919 Crohn's disease, unspecified, with unspecified complications: Secondary | ICD-10-CM

## 2022-07-17 DIAGNOSIS — M79622 Pain in left upper arm: Secondary | ICD-10-CM | POA: Diagnosis not present

## 2022-07-17 MED ORDER — ONDANSETRON 4 MG PO TBDP: 4.0000 mg | ORAL_TABLET | Freq: Three times a day (TID) | ORAL | 0 refills | Status: AC | PRN

## 2022-07-17 MED ORDER — NAPROXEN 500 MG PO TABS: 500.0000 mg | ORAL_TABLET | Freq: Two times a day (BID) | ORAL | 0 refills | Status: AC

## 2022-07-17 NOTE — Discharge Instructions (Addendum)
-  Zofran: 1 tablet placed under the tongue allowed to dissolve every 8 hours as needed for nausea -Naproxen: 1 tablet twice a day, would take for 3-4 days see if this helps with the pain. -would continue bland diet until symptoms improve -chest xray negative. If no improvement with naproxen, would follow up with PCP

## 2022-07-17 NOTE — ED Triage Notes (Addendum)
Patient reports nausea and vomiting today and sharp pain under left breast x day 6. Patient states short, stabbing pain that comes and goes.

## 2022-07-17 NOTE — ED Provider Notes (Signed)
[facility]    CSN: [id number] Arrival date & time: 07/17/22  1655      History   Chief Complaint Chief Complaint  Patient presents with   Emesis    HPI [name] is a 36 y.o. female.   Patient is a 36 year old female who presents with right axillary pain started last Thursday.  She reports it is sharp pain.  She denies any numbness or any faint feeling.  She does report that the left arm is a 1 she usually holds her child with.  States that the pain is not reproducible with movement or anything like that.  She also states she thought it may be related to a brassiere that she was wearing but states she still has the pain while going without 1.  She reports pain is off-and-on and there is nothing really any certain thing causes the pain to pop up.  States its only intermittent.  She also states she does have a history of anxiety but has not had any anxiety related pain similar to this in the past.  She denies any upper respiratory symptoms.  Patient also reports nausea vomiting today.  She reports she is take care of her sick child.  She does have a history of Crohn's disease but has not had any medication needs for that at this point.  Patient is taken Tylenol for pain as well as Zofran for nausea.  Reports initially thought the pain may be related to reflux so she has been eating more of a bland diet today.    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 Authorizing Provider  naproxen (NAPROSYN) 500 MG tablet Take 1 tablet (500 mg total) by mouth 2 (two) times daily. 07/17/22  Yes Luvenia Redden, PA-C  ondansetron (ZOFRAN-ODT) 4 MG disintegrating tablet Take 1 tablet (4 mg total) by mouth every 8 (eight) hours as needed for nausea or vomiting. 07/17/22  Yes Luvenia Redden, PA-C  pantoprazole (PROTONIX) 40 MG tablet Take 1 tablet (40 mg total) by mouth daily. 03/19/18 03/19/19  Rudene Re, MD    Family History Family History  Problem Relation Age of Onset   Thyroid disease Mother    Bipolar disorder Mother    OCD Mother    Cancer Father 71  prostate cancer   Thyroid disease Sister     Social History Social History   Tobacco Use   Smoking status: Former    Types: Cigarettes    Quit date: 03/19/2017    Years since quitting: 5.3   Smokeless tobacco: Never  Vaping Use   Vaping Use: Never used  Substance Use Topics   Alcohol use: No   Drug use: Not Currently    Types: Marijuana    Comment: patient denies use      Allergies   Buprenorphine hcl, Morphine, Origanum oil, Demerol [meperidine], Imitrex [sumatriptan], Morphine and related, Seroquel  [quetiapine], Seroquel [quetiapine fumarate], Sumatriptan succinate, Citalopram, and Other   Review of Systems Review of Systems as noted in HPI.  Other systems reviewed and found to be negative   Physical Exam Triage Vital Signs ED Triage Vitals  Enc Vitals Group     BP 07/17/22 1708 117/75     Pulse Rate 07/17/22 1708 88     Resp 07/17/22 1708 18     Temp 07/17/22 1708 98.3 F (36.8 C)     Temp Source 07/17/22 1708 Oral     SpO2 07/17/22 1708 97 %     Weight 07/17/22  1714 150 lb (68 kg)     Height 07/17/22 1714 '5\' 2"'$  (1.575 m)     Head Circumference --      Peak Flow --      Pain Score 07/17/22 1713 7     Pain Loc --      Pain Edu? --      Excl. in [place]? --    No data found.  Updated Vital Signs BP 117/75 (BP Location: Left Arm)   Pulse 88   Temp 98.3 F (36.8 C) (Oral)   Resp 18   Ht '5\' 2"'$  (1.575 m)   Wt 150 lb (68 kg)   LMP 07/04/2022   SpO2 97%   BMI 27.44 kg/m   Visual Acuity Right Eye Distance:   Left Eye Distance:   Bilateral Distance:    Right Eye Near:   Left Eye Near:    Bilateral Near:     Physical Exam Constitutional:      Appearance: Normal appearance.  HENT:     Head: Normocephalic and atraumatic.  Cardiovascular:     Rate and Rhythm: Normal rate and regular rhythm.     Pulses: Normal pulses.     Heart sounds: Normal heart sounds. No murmur heard. Pulmonary:     Effort: Pulmonary effort is normal.     Breath sounds: Normal breath sounds. No wheezing or rhonchi.  Musculoskeletal:       Arms:  Skin:    Capillary Refill: Capillary refill takes less than 2 seconds.  Neurological:     General: No focal deficit present.     Mental Status: She is alert and oriented to person, place, and time.      UC Treatments / Results  Labs (all labs ordered are listed, but only abnormal results are displayed) Labs Reviewed - No data to display  EKG   Radiology DG Chest 2 View  Result Date: 07/17/2022 CLINICAL DATA:  Nausea and vomiting with sharp pain under the left breast for 6 days. EXAM: CHEST - 2 VIEW COMPARISON:  Radiographs 01/18/2015. FINDINGS: Port-A-Cath has been removed in the interval. The heart size and mediastinal contours are normal. The lungs are clear. There is no pleural effusion or pneumothorax. No acute osseous findings are identified. Mild thoracic spine  degenerative changes. IMPRESSION: No evidence of active cardiopulmonary process. Electronically Signed   By: Richardean Sale M.D.   On: 07/17/2022  17:58    Procedures Procedures (including critical care time)  Medications Ordered in UC Medications - No data to display  Initial Impression / Assessment and Plan / UC Course  I have reviewed the triage vital signs and the nursing notes.  Pertinent labs & imaging results that were available during my care of the patient were reviewed by me and considered in my medical decision making (see chart for details).  Clinical Course as of 07/17/22 1805  Tue Jul 17, 2022  1755 DG Chest 2 View [MH]  1802 DG Chest 2 View [MH]    Clinical Course User Index [MH] Luvenia Redden, PA-C   Patient presents with left x-ray pain has been ongoing since last Thursday (today is Tuesday).  Pain not related to the movements or deep breaths and is nonreproducible on exam.  Patient denies any trauma or any discoloration or rash.  We will check an x-ray.  Nausea vomiting could possibly be related to her child's illness patient also has a history of Crohn's disease but states she is not any medication for it.  She does have antiemetic medication at home from previous issues. Final Clinical Impressions(s) / UC Diagnoses   Final diagnoses:  Nausea and vomiting, unspecified vomiting type  Crohn's disease with complication, unspecified gastrointestinal tract location [facility])  Left axillary pain     Discharge Instructions      -Zofran: 1 tablet placed under the tongue allowed to dissolve every 8 hours as needed for nausea -Naproxen: 1 tablet twice a day, would take for 3-4 days see if this helps with the pain. -would continue bland diet until symptoms improve -chest xray negative. If no improvement with naproxen, would follow up with PCP     ED Prescriptions     Medication Sig Dispense Auth. Provider   ondansetron (ZOFRAN-ODT) 4 MG disintegrating tablet Take 1 tablet (4 mg total) by mouth every 8 (eight) hours as needed for nausea or vomiting. 20 tablet Luvenia Redden, PA-C   naproxen (NAPROSYN) 500  MG tablet Take 1 tablet (500 mg total) by mouth 2 (two) times daily. 30 tablet Luvenia Redden, PA-C      PDMP not reviewed this encounter.   Luvenia Redden, PA-C 07/17/22 1805

## 2022-08-14 ENCOUNTER — Ambulatory Visit
Admission: RE | Admit: 2022-08-14 | Discharge: 2022-08-14 | Disposition: A | Discharge status: Home / Self Care | Payer: Medicaid Other | Source: Ambulatory Visit | Attending: Emergency Medicine | Admitting: Emergency Medicine

## 2022-08-14 VITALS — BP 125/96 | HR 78 | Temp 98.5°F | Ht 62.0 in | Wt 150.0 lb

## 2022-08-14 DIAGNOSIS — K120 Recurrent oral aphthae: Secondary | ICD-10-CM

## 2022-08-14 LAB — GROUP A STREP BY PCR: Group A Strep by PCR: NOT DETECTED

## 2022-08-14 MED ORDER — DEBACTEROL 30-50 % MT SOLN: 1.0000 | Freq: Once | OROMUCOSAL | 1 refills | Status: AC

## 2022-08-14 MED ORDER — VALACYCLOVIR HCL 1 G PO TABS: 2000.0000 mg | ORAL_TABLET | Freq: Two times a day (BID) | ORAL | 0 refills | Status: AC

## 2022-08-14 NOTE — ED Provider Notes (Signed)
[facility]    CSN: [id number] Arrival date & time: 08/14/22  1635      History   Chief Complaint Chief Complaint  Patient presents with   Sore Throat    HPI MCKINNA DEMARS is a 36 y.o. female.   HPI  36 year old female here for evaluation of sore throat.  Patient reports that her symptoms began with a cold sore on the inner aspect of her lower lip on the left 7 days ago.  Since then she has developed swelling of her gums and a sore throat.  She denies any fever, runny nose, nasal congestion, or cough.  She denies any mouth trauma or eating a lot of acidic foods.  She does have a history of cold sores and she has used over-the-counter treatments without any improvement of symptoms.  She states that she has not been getting a lot of sleep lately because there has been an upper respiratory virus going through the house.  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 Authorizing Provider  Sulfuric Acid-Sulf Phenolics (DEBACTEROL) 27-03 % SOLN Use as directed 1 applicator in the mouth or throat once for 1 dose. 08/14/22 08/14/22 Yes Margarette Canada, NP  valACYclovir  (VALTREX) 1000 MG tablet Take 2 tablets (2,000 mg total) by mouth 2 (two) times daily. 08/14/22  Yes Margarette Canada, NP  naproxen (NAPROSYN) 500 MG tablet Take 1 tablet (500 mg total) by mouth 2 (two) times daily. 07/17/22   Luvenia Redden, PA-C  ondansetron (ZOFRAN-ODT) 4 MG disintegrating tablet Take 1 tablet (4 mg total) by mouth every 8 (eight) hours as needed for nausea or vomiting. 07/17/22   Luvenia Redden, PA-C  pantoprazole (PROTONIX) 40 MG tablet Take 1 tablet (40 mg total) by mouth daily. 03/19/18 03/19/19  Rudene Re, MD    Family History Family History  Problem Relation Age of Onset   Thyroid disease Mother    Bipolar disorder Mother    OCD Mother    Cancer Father 66       prostate cancer   Thyroid disease Sister     Social History Social History   Tobacco Use   Smoking status: Former    Types: Cigarettes    Quit date: 03/19/2017    Years since quitting: 5.4   Smokeless  tobacco: Never  Vaping Use   Vaping Use: Never used  Substance Use Topics   Alcohol use: No   Drug use: Not Currently    Types: Marijuana    Comment: patient denies use      Allergies   Buprenorphine hcl, Morphine, Origanum oil, Demerol [meperidine], Imitrex [sumatriptan], Morphine and related, Seroquel  [quetiapine], Seroquel [quetiapine fumarate], Sumatriptan succinate, Citalopram, and Other   Review of Systems Review of Systems  Constitutional:  Negative for fever.  HENT:  Positive for mouth sores and sore throat. Negative for congestion, ear pain and rhinorrhea.   Respiratory:  Negative for cough.      Physical Exam Triage Vital Signs ED Triage Vitals  Enc Vitals Group     BP 08/14/22 1701 (!) 125/96     Pulse Rate 08/14/22 1701 78     Resp --      Temp 08/14/22 1701 98.5 F (36.9 C)     Temp Source 08/14/22 1701 Oral     SpO2 08/14/22 1701 98 %     Weight 08/14/22 1700 150 lb (68 kg)     Height 08/14/22 1700 '5\' 2"'$  (1.575 m)     Head Circumference --      Peak Flow  --      Pain Score 08/14/22 1700 6     Pain Loc --      Pain Edu? --      Excl. in [place]? --    No data found.  Updated Vital Signs BP (!) 125/96 (BP Location: Left Arm)   Pulse 78   Temp 98.5 F (36.9 C) (Oral)   Ht '5\' 2"'$  (1.575 m)   Wt 150 lb (68 kg)   LMP 08/03/2022   SpO2 98%   BMI 27.44 kg/m   Visual Acuity Right Eye Distance:   Left Eye Distance:   Bilateral Distance:    Right Eye Near:   Left Eye Near:    Bilateral Near:     Physical Exam Vitals and nursing note reviewed.  Constitutional:      Appearance: Normal appearance. She is not ill-appearing.  HENT:     Head: Normocephalic and atraumatic.     Mouth/Throat:     Mouth: Mucous membranes are moist.     Pharynx: Oropharynx is clear. Posterior oropharyngeal erythema present.     Comments: Aphthous ulcer on the lower lip on the left as well as on the soft palate on the right.  Gums do not appear erythematous or swollen.  No other oral lesions noted. Skin:    General: Skin is warm and dry.     Capillary Refill: Capillary refill takes less than 2 seconds.     Findings: No erythema or rash.  Neurological:     General: No focal deficit present.     Mental Status: She is alert and oriented to person, place, and time.  Psychiatric:        Mood and Affect: Mood normal.        Behavior: Behavior normal.        Thought Content: Thought content normal.        Judgment: Judgment normal.      UC Treatments / Results  Labs (all labs ordered are listed, but only abnormal results are displayed) Labs Reviewed  GROUP A STREP BY PCR    EKG   Radiology No results found.  Procedures Procedures (including critical care time)  Medications Ordered in UC Medications - No data to display  Initial Impression / Assessment and Plan / UC Course  I have reviewed the triage vital signs and the nursing notes.  Pertinent labs & imaging results that were available during my care of the patient were reviewed by me and  considered in my medical decision making (see chart for details).   Patient is a pleasant, nontoxic-appearing 36 year old female here for evaluation of a lesion on the inside of her lower lip as well as sore throat that has been going on for the past 7 days.  On exam patient does have a large aphthous ulcer just past the wet dry border on the lower lip on the left.  She also has another 1 on her soft palate on the right.  No other lesions noted.  A strep PCR was collected at triage and was negative.  Her exam is consistent with aphthous ulcers which are viral in nature.  They were most likely brought on by the fact that she has been rundown because she has been getting a lot of sleep due to her children having colds.  I will prescribe Valtrex though is limited efficacy given that she is 7 days into her cold sore outbreak.  I will also prescribe Debacterol that she can use as a single application to each cold sore to help speed resolution.  Return precautions reviewed.   Final Clinical Impressions(s) / UC Diagnoses   Final diagnoses:  Aphthous ulcer of mouth     Discharge Instructions      Take 2 gram 40fValtrex every 12 hours for 1 day for treatment of your cold sores.  You can continue to use OTC Tylenol and Ibuprofen as needed for pain.  Apply 1 Debacterol applicator to your cold sore and hold for 5-10 seconds. Only treat each cold sore once.  Return for new or worsening symptoms.     ED Prescriptions     Medication Sig Dispense Auth. Provider   valACYclovir (VALTREX) 1000 MG tablet Take 2 tablets (2,000 mg total) by mouth 2 (two) times daily. 30 tablet RMargarette Canada NP   Sulfuric Acid-Sulf Phenolics (DEBACTEROL) 363-01% SOLN Use as directed 1 applicator in the mouth or throat once for 1 dose. 10 each RMargarette Canada NP      PDMP not reviewed this encounter.   RMargarette Canada NP 08/14/22 1814

## 2022-08-14 NOTE — Discharge Instructions (Addendum)
Take 2 gram 47fValtrex every 12 hours for 1 day for treatment of your cold sores.  You can continue to use OTC Tylenol and Ibuprofen as needed for pain.  Apply 1 Debacterol applicator to your cold sore and hold for 5-10 seconds. Only treat each cold sore once.  Return for new or worsening symptoms.

## 2022-08-14 NOTE — ED Triage Notes (Signed)
Patient reports that her gums are swollen, throat is sore, and  throat feels swollen -- patient reports that she is on day 7

## 2022-12-03 ENCOUNTER — Ambulatory Visit
Admission: EM | Admit: 2022-12-03 | Discharge: 2022-12-03 | Disposition: A | Discharge status: Home / Self Care | Payer: Medicaid Other | Attending: Emergency Medicine | Admitting: Emergency Medicine

## 2022-12-03 DIAGNOSIS — J0100 Acute maxillary sinusitis, unspecified: Secondary | ICD-10-CM

## 2022-12-03 MED ORDER — AMOXICILLIN 875 MG PO TABS: 875.0000 mg | ORAL_TABLET | Freq: Two times a day (BID) | ORAL | 0 refills | Status: AC

## 2022-12-03 NOTE — ED Triage Notes (Signed)
Patient to Urgent Care with complaints of nasal congestion x2 weeks. Discolored sinus drainage. Jaw and teeth pain/ sinus pressure. Denies any known fevers.   Reports using otc cold and flu medications without any improvements. Using a netti pot.   Reports 6+ sinus surgeries.

## 2022-12-03 NOTE — Discharge Instructions (Addendum)
Take the amoxicillin as directed.  Follow up with your primary care provider if your symptoms are not improving.   ° ° °

## 2022-12-03 NOTE — ED Provider Notes (Signed)
[name]    CSN: [id number] Arrival date & time: 12/03/22  0944      History   Chief Complaint Chief Complaint  Patient presents with   Nasal Congestion    Nasal congestion otc usage for 2 weeks and still not better - Entered by patient    HPI [name] is a 36 y.o. female.  Patient presents with 2 week history of congestion, sinus pressure, cough.  No fever, sore throat, shortness of breath, or other symptoms.  Treatment at home with OTC cold medications; none today.  Her medical history includes Crohn's colitis, IBS, ulcerative enterocolitis, gastric ulcers, vertigo, migraine headaches, anxiety, depression, bipolar 1 disorder.    The history is provided by the patient and medical records.    Past Medical History:  Diagnosis Date   Anxiety    Bipolar 1 disorder ([place])    Crohn's colitis ([place])    Depression    History of multiple concussions    Irritable bowel    Migraines    Multiple gastric ulcers    Ulcerative (chronic) enterocolitis ([place])    Vertigo     Patient Active Problem List   Diagnosis Date Noted   Bacterial vaginal infection 02/05/2017   Anxiety and depression 08/30/2014   Poor venous access 06/12/2014   Bowel disease, inflammatory 06/12/2014    Past Surgical History:  Procedure Laterality Date   NASAL SINUS SURGERY     x6   RHINOPLASTY     TONSILLECTOMY     TUBAL LIGATION      OB History     Gravida  2   Para  0   Term  0   Preterm  0   AB  0   Living  1      SAB  0   IAB  0   Ectopic  0   Multiple  0   Live Births  1            Home Medications    Prior to Admission medications   Medication Sig Start Date End Date Taking? Authorizing Provider  amoxicillin (AMOXIL) 875 MG tablet Take 1 tablet (875 mg total) by mouth 2 (two) times daily for 10 days. 12/03/22 12/13/22 Yes Sharion Balloon, NP  naproxen (NAPROSYN) 500 MG tablet Take 1 tablet (500 mg total) by mouth 2 (two) times daily. 07/17/22   Luvenia Redden, PA-C  ondansetron (ZOFRAN-ODT) 4 MG disintegrating tablet Take 1 tablet (4 mg total) by mouth every 8 (eight) hours as needed for nausea or vomiting. 07/17/22   Luvenia Redden, PA-C  pantoprazole (PROTONIX) 40 MG tablet Take 1 tablet (40 mg total) by mouth daily. 03/19/18 03/19/19  Rudene Re, MD  valACYclovir (VALTREX) 1000 MG tablet Take 2 tablets (2,000 mg total) by mouth 2 (two) times daily. 08/14/22   Margarette Canada, NP    Family History Family History  Problem Relation Age of Onset   Thyroid disease Mother    Bipolar disorder Mother    OCD Mother    Cancer Father 5       prostate cancer   Thyroid disease Sister     Social History Social History   Tobacco Use   Smoking status: Former    Types: Cigarettes    Quit date: 03/19/2017    Years since quitting: 5.7   Smokeless tobacco: Never  Vaping Use   Vaping Use: Never used  Substance Use Topics   Alcohol use: No  Drug use: Not Currently    Types: Marijuana    Comment: patient denies use      Allergies   Buprenorphine hcl, Morphine, Or[place]um oil, Demerol [meperidine], Imitrex [sumatriptan], Morphine and related, Seroquel  [quetiapine], Seroquel [quetiapine fumarate], Sumatriptan succinate, Citalopram, and Other   Review of Systems Review of Systems  Constitutional:  Negative for chills and fever.  HENT:  Positive for congestion, postnasal drip, rhinorrhea and sinus pressure. Negative for ear pain and sore throat.   Respiratory:  Positive for cough. Negative for shortness of breath.   Cardiovascular:  Negative for chest pain and palpitations.  Gastrointestinal:  Negative for diarrhea and vomiting.  Skin:  Negative for color change and rash.  All other systems reviewed and are negative.    Physical Exam Triage Vital Signs ED Triage Vitals  Enc Vitals Group     BP      Pulse      Resp      Temp      Temp src      SpO2      Weight      Height      Head Circumference      Peak Flow       Pain Score      Pain Loc      Pain Edu?      Excl. in [place]?    No data found.  Updated Vital Signs BP 122/86   Pulse 70   Temp 98.1 F (36.7 C)   Resp 18   LMP 11/03/2022   SpO2 97%   Visual Acuity Right Eye Distance:   Left Eye Distance:   Bilateral Distance:    Right Eye Near:   Left Eye Near:    Bilateral Near:     Physical Exam Vitals and nursing note reviewed.  Constitutional:      General: She is not in acute distress.    Appearance: Normal appearance. She is well-developed. She is not ill-appearing.  HENT:     Right Ear: Tympanic membrane normal.     Left Ear: Tympanic membrane normal.     Nose: Congestion and rhinorrhea present.     Mouth/Throat:     Mouth: Mucous membranes are moist.     Pharynx: Oropharynx is clear.  Cardiovascular:     Rate and Rhythm: Normal rate and regular rhythm.     Heart sounds: Normal heart sounds.  Pulmonary:     Effort: Pulmonary effort is normal. No respiratory distress.     Breath sounds: Normal breath sounds.  Musculoskeletal:     Cervical back: Neck supple.  Skin:    General: Skin is warm and dry.  Neurological:     Mental Status: She is alert.  Psychiatric:        Mood and Affect: Mood normal.        Behavior: Behavior normal.      UC Treatments / Results  Labs (all labs ordered are listed, but only abnormal results are displayed) Labs Reviewed - No data to display  EKG   Radiology No results found.  Procedures Procedures (including critical care time)  Medications Ordered in UC Medications - No data to display  Initial Impression / Assessment and Plan / UC Course  I have reviewed the triage vital signs and the nursing notes.  Pertinent labs & imaging results that were available during my care of the patient were reviewed by me and considered in my medical decision making (see chart for  details).    Acute sinusitis.  Treating with amoxicillin.  Education provided on sinus infection.  Instructed  patient to follow up with her PCP if her symptoms are not improving.  She agrees to plan of care.    Final Clinical Impressions(s) / UC Diagnoses   Final diagnoses:  Acute non-recurrent maxillary sinusitis     Discharge Instructions      Take the amoxicillin as directed.  Follow up with your primary care provider if your symptoms are not improving.        ED Prescriptions     Medication Sig Dispense Auth. Provider   amoxicillin (AMOXIL) 875 MG tablet Take 1 tablet (875 mg total) by mouth 2 (two) times daily for 10 days. 20 tablet Sharion Balloon, NP      PDMP not reviewed this encounter.   Sharion Balloon, NP 12/03/22 1029

## 2024-10-16 ENCOUNTER — Other Ambulatory Visit: Payer: Self-pay

## 2024-10-16 MED ORDER — AMPHETAMINE-DEXTROAMPHET ER 10 MG PO CP24
40.0000 mg | ORAL_CAPSULE | Freq: Every morning | ORAL | 0 refills | Status: AC
  Filled 2024-10-16: qty 120, 30d supply, fill #0

## 2024-10-17 ENCOUNTER — Other Ambulatory Visit: Payer: Self-pay

## 2024-10-19 ENCOUNTER — Other Ambulatory Visit: Payer: Self-pay

## 2024-11-09 ENCOUNTER — Other Ambulatory Visit: Payer: Self-pay

## 2024-11-09 MED ORDER — AMPHETAMINE-DEXTROAMPHET ER 10 MG PO CP24
40.0000 mg | ORAL_CAPSULE | Freq: Every morning | ORAL | 0 refills | Status: AC
  Filled 2024-11-18: qty 120, 30d supply, fill #0

## 2024-11-18 ENCOUNTER — Other Ambulatory Visit: Payer: Self-pay
